# Patient Record
Sex: Female | Born: 1965 | Race: Black or African American | Hispanic: No | Marital: Single | Smoking: Former smoker
Health system: Southern US, Community
[De-identification: ages and names within clinical notes are randomized; demographics above are authoritative.]

## PROBLEM LIST (undated history)

## (undated) DIAGNOSIS — E039 Hypothyroidism, unspecified: Secondary | ICD-10-CM

## (undated) DIAGNOSIS — M1990 Unspecified osteoarthritis, unspecified site: Secondary | ICD-10-CM

## (undated) DIAGNOSIS — Z21 Asymptomatic human immunodeficiency virus [HIV] infection status: Secondary | ICD-10-CM

## (undated) DIAGNOSIS — E119 Type 2 diabetes mellitus without complications: Secondary | ICD-10-CM

## (undated) DIAGNOSIS — Z8719 Personal history of other diseases of the digestive system: Secondary | ICD-10-CM

## (undated) DIAGNOSIS — K219 Gastro-esophageal reflux disease without esophagitis: Secondary | ICD-10-CM

## (undated) DIAGNOSIS — B20 Human immunodeficiency virus [HIV] disease: Secondary | ICD-10-CM

## (undated) DIAGNOSIS — I10 Essential (primary) hypertension: Secondary | ICD-10-CM

## (undated) DIAGNOSIS — N3281 Overactive bladder: Secondary | ICD-10-CM

## (undated) DIAGNOSIS — T7840XA Allergy, unspecified, initial encounter: Secondary | ICD-10-CM

## (undated) DIAGNOSIS — J189 Pneumonia, unspecified organism: Secondary | ICD-10-CM

## (undated) HISTORY — PX: ABDOMINAL HYSTERECTOMY: SHX81

## (undated) HISTORY — PX: CRYOTHERAPY: SHX1416

## (undated) HISTORY — DX: Essential (primary) hypertension: I10

## (undated) HISTORY — PX: BREAST SURGERY: SHX581

## (undated) HISTORY — DX: Allergy, unspecified, initial encounter: T78.40XA

## (undated) HISTORY — PX: BIOPSY THYROID: PRO38

## (undated) HISTORY — DX: Overactive bladder: N32.81

---

## 2005-09-30 DIAGNOSIS — J189 Pneumonia, unspecified organism: Secondary | ICD-10-CM

## 2005-09-30 HISTORY — DX: Pneumonia, unspecified organism: J18.9

## 2009-09-30 HISTORY — PX: JOINT REPLACEMENT: SHX530

## 2010-09-30 HISTORY — PX: CARPAL TUNNEL RELEASE: SHX101

## 2011-10-01 HISTORY — PX: CERVIX SURGERY: SHX593

## 2012-09-30 HISTORY — PX: COLONOSCOPY: SHX174

## 2015-10-12 DIAGNOSIS — H25013 Cortical age-related cataract, bilateral: Secondary | ICD-10-CM | POA: Diagnosis not present

## 2015-10-12 DIAGNOSIS — H524 Presbyopia: Secondary | ICD-10-CM | POA: Diagnosis not present

## 2015-10-12 DIAGNOSIS — H11153 Pinguecula, bilateral: Secondary | ICD-10-CM | POA: Diagnosis not present

## 2015-10-19 DIAGNOSIS — M25552 Pain in left hip: Secondary | ICD-10-CM | POA: Diagnosis not present

## 2015-10-19 DIAGNOSIS — Z9889 Other specified postprocedural states: Secondary | ICD-10-CM | POA: Diagnosis not present

## 2015-10-19 DIAGNOSIS — R200 Anesthesia of skin: Secondary | ICD-10-CM | POA: Diagnosis not present

## 2015-10-19 DIAGNOSIS — M545 Low back pain: Secondary | ICD-10-CM | POA: Diagnosis not present

## 2015-10-31 DIAGNOSIS — E7800 Pure hypercholesterolemia, unspecified: Secondary | ICD-10-CM | POA: Diagnosis not present

## 2015-10-31 DIAGNOSIS — K219 Gastro-esophageal reflux disease without esophagitis: Secondary | ICD-10-CM | POA: Diagnosis not present

## 2015-10-31 DIAGNOSIS — Z21 Asymptomatic human immunodeficiency virus [HIV] infection status: Secondary | ICD-10-CM | POA: Diagnosis not present

## 2015-10-31 DIAGNOSIS — E039 Hypothyroidism, unspecified: Secondary | ICD-10-CM | POA: Diagnosis not present

## 2015-10-31 DIAGNOSIS — M79671 Pain in right foot: Secondary | ICD-10-CM | POA: Diagnosis not present

## 2015-10-31 DIAGNOSIS — I10 Essential (primary) hypertension: Secondary | ICD-10-CM | POA: Diagnosis not present

## 2015-10-31 DIAGNOSIS — G8929 Other chronic pain: Secondary | ICD-10-CM | POA: Diagnosis not present

## 2015-11-17 DIAGNOSIS — I10 Essential (primary) hypertension: Secondary | ICD-10-CM | POA: Diagnosis not present

## 2015-11-17 DIAGNOSIS — E7800 Pure hypercholesterolemia, unspecified: Secondary | ICD-10-CM | POA: Diagnosis not present

## 2015-11-17 DIAGNOSIS — J019 Acute sinusitis, unspecified: Secondary | ICD-10-CM | POA: Diagnosis not present

## 2015-11-17 DIAGNOSIS — R5383 Other fatigue: Secondary | ICD-10-CM | POA: Diagnosis not present

## 2015-11-17 DIAGNOSIS — E039 Hypothyroidism, unspecified: Secondary | ICD-10-CM | POA: Diagnosis not present

## 2016-01-17 DIAGNOSIS — E069 Thyroiditis, unspecified: Secondary | ICD-10-CM | POA: Diagnosis not present

## 2016-01-17 DIAGNOSIS — B20 Human immunodeficiency virus [HIV] disease: Secondary | ICD-10-CM | POA: Diagnosis not present

## 2016-01-23 DIAGNOSIS — Z1231 Encounter for screening mammogram for malignant neoplasm of breast: Secondary | ICD-10-CM | POA: Diagnosis not present

## 2016-01-31 DIAGNOSIS — B20 Human immunodeficiency virus [HIV] disease: Secondary | ICD-10-CM | POA: Diagnosis not present

## 2016-01-31 DIAGNOSIS — I10 Essential (primary) hypertension: Secondary | ICD-10-CM | POA: Diagnosis not present

## 2016-01-31 DIAGNOSIS — E039 Hypothyroidism, unspecified: Secondary | ICD-10-CM | POA: Diagnosis not present

## 2016-03-08 DIAGNOSIS — B360 Pityriasis versicolor: Secondary | ICD-10-CM | POA: Diagnosis not present

## 2016-03-08 DIAGNOSIS — L818 Other specified disorders of pigmentation: Secondary | ICD-10-CM | POA: Diagnosis not present

## 2016-03-25 DIAGNOSIS — E669 Obesity, unspecified: Secondary | ICD-10-CM | POA: Diagnosis not present

## 2016-03-25 DIAGNOSIS — I10 Essential (primary) hypertension: Secondary | ICD-10-CM | POA: Diagnosis not present

## 2016-03-25 DIAGNOSIS — B20 Human immunodeficiency virus [HIV] disease: Secondary | ICD-10-CM | POA: Diagnosis not present

## 2016-03-25 DIAGNOSIS — Z6838 Body mass index (BMI) 38.0-38.9, adult: Secondary | ICD-10-CM | POA: Diagnosis not present

## 2016-05-31 DIAGNOSIS — B20 Human immunodeficiency virus [HIV] disease: Secondary | ICD-10-CM | POA: Diagnosis not present

## 2016-05-31 DIAGNOSIS — E785 Hyperlipidemia, unspecified: Secondary | ICD-10-CM | POA: Diagnosis not present

## 2016-05-31 DIAGNOSIS — Z79899 Other long term (current) drug therapy: Secondary | ICD-10-CM | POA: Diagnosis not present

## 2016-06-05 DIAGNOSIS — R7303 Prediabetes: Secondary | ICD-10-CM | POA: Diagnosis not present

## 2016-06-05 DIAGNOSIS — B20 Human immunodeficiency virus [HIV] disease: Secondary | ICD-10-CM | POA: Diagnosis not present

## 2016-06-05 DIAGNOSIS — E669 Obesity, unspecified: Secondary | ICD-10-CM | POA: Diagnosis not present

## 2016-06-05 DIAGNOSIS — E785 Hyperlipidemia, unspecified: Secondary | ICD-10-CM | POA: Diagnosis not present

## 2016-06-05 DIAGNOSIS — E039 Hypothyroidism, unspecified: Secondary | ICD-10-CM | POA: Diagnosis not present

## 2016-06-05 DIAGNOSIS — Z6839 Body mass index (BMI) 39.0-39.9, adult: Secondary | ICD-10-CM | POA: Diagnosis not present

## 2016-06-05 DIAGNOSIS — R635 Abnormal weight gain: Secondary | ICD-10-CM | POA: Diagnosis not present

## 2016-06-05 DIAGNOSIS — I10 Essential (primary) hypertension: Secondary | ICD-10-CM | POA: Diagnosis not present

## 2016-06-24 DIAGNOSIS — E039 Hypothyroidism, unspecified: Secondary | ICD-10-CM | POA: Diagnosis not present

## 2016-06-24 DIAGNOSIS — I10 Essential (primary) hypertension: Secondary | ICD-10-CM | POA: Diagnosis not present

## 2016-06-24 DIAGNOSIS — E669 Obesity, unspecified: Secondary | ICD-10-CM | POA: Diagnosis not present

## 2016-06-24 DIAGNOSIS — Z6839 Body mass index (BMI) 39.0-39.9, adult: Secondary | ICD-10-CM | POA: Diagnosis not present

## 2016-06-24 DIAGNOSIS — E785 Hyperlipidemia, unspecified: Secondary | ICD-10-CM | POA: Diagnosis not present

## 2016-06-24 DIAGNOSIS — F17210 Nicotine dependence, cigarettes, uncomplicated: Secondary | ICD-10-CM | POA: Diagnosis not present

## 2016-06-24 DIAGNOSIS — Z1389 Encounter for screening for other disorder: Secondary | ICD-10-CM | POA: Diagnosis not present

## 2016-06-24 DIAGNOSIS — K219 Gastro-esophageal reflux disease without esophagitis: Secondary | ICD-10-CM | POA: Diagnosis not present

## 2016-06-24 DIAGNOSIS — B20 Human immunodeficiency virus [HIV] disease: Secondary | ICD-10-CM | POA: Diagnosis not present

## 2016-07-09 DIAGNOSIS — H2513 Age-related nuclear cataract, bilateral: Secondary | ICD-10-CM | POA: Diagnosis not present

## 2016-07-09 DIAGNOSIS — H11153 Pinguecula, bilateral: Secondary | ICD-10-CM | POA: Diagnosis not present

## 2016-07-09 DIAGNOSIS — H524 Presbyopia: Secondary | ICD-10-CM | POA: Diagnosis not present

## 2016-07-30 DIAGNOSIS — E785 Hyperlipidemia, unspecified: Secondary | ICD-10-CM | POA: Diagnosis not present

## 2016-07-30 DIAGNOSIS — K219 Gastro-esophageal reflux disease without esophagitis: Secondary | ICD-10-CM | POA: Diagnosis not present

## 2016-07-30 DIAGNOSIS — I10 Essential (primary) hypertension: Secondary | ICD-10-CM | POA: Diagnosis not present

## 2016-07-30 DIAGNOSIS — E039 Hypothyroidism, unspecified: Secondary | ICD-10-CM | POA: Diagnosis not present

## 2016-09-29 DIAGNOSIS — E039 Hypothyroidism, unspecified: Secondary | ICD-10-CM | POA: Diagnosis not present

## 2016-09-29 DIAGNOSIS — I10 Essential (primary) hypertension: Secondary | ICD-10-CM | POA: Diagnosis not present

## 2016-09-29 DIAGNOSIS — E785 Hyperlipidemia, unspecified: Secondary | ICD-10-CM | POA: Diagnosis not present

## 2016-09-29 DIAGNOSIS — K219 Gastro-esophageal reflux disease without esophagitis: Secondary | ICD-10-CM | POA: Diagnosis not present

## 2016-10-30 DIAGNOSIS — E039 Hypothyroidism, unspecified: Secondary | ICD-10-CM | POA: Diagnosis not present

## 2016-10-30 DIAGNOSIS — E785 Hyperlipidemia, unspecified: Secondary | ICD-10-CM | POA: Diagnosis not present

## 2016-10-30 DIAGNOSIS — I10 Essential (primary) hypertension: Secondary | ICD-10-CM | POA: Diagnosis not present

## 2016-10-30 DIAGNOSIS — K219 Gastro-esophageal reflux disease without esophagitis: Secondary | ICD-10-CM | POA: Diagnosis not present

## 2016-11-27 DIAGNOSIS — I10 Essential (primary) hypertension: Secondary | ICD-10-CM | POA: Diagnosis not present

## 2016-11-27 DIAGNOSIS — E785 Hyperlipidemia, unspecified: Secondary | ICD-10-CM | POA: Diagnosis not present

## 2016-11-27 DIAGNOSIS — K219 Gastro-esophageal reflux disease without esophagitis: Secondary | ICD-10-CM | POA: Diagnosis not present

## 2016-11-27 DIAGNOSIS — E039 Hypothyroidism, unspecified: Secondary | ICD-10-CM | POA: Diagnosis not present

## 2016-12-28 DIAGNOSIS — I10 Essential (primary) hypertension: Secondary | ICD-10-CM | POA: Diagnosis not present

## 2016-12-28 DIAGNOSIS — K219 Gastro-esophageal reflux disease without esophagitis: Secondary | ICD-10-CM | POA: Diagnosis not present

## 2016-12-28 DIAGNOSIS — E785 Hyperlipidemia, unspecified: Secondary | ICD-10-CM | POA: Diagnosis not present

## 2016-12-28 DIAGNOSIS — E039 Hypothyroidism, unspecified: Secondary | ICD-10-CM | POA: Diagnosis not present

## 2017-01-01 DIAGNOSIS — J0100 Acute maxillary sinusitis, unspecified: Secondary | ICD-10-CM | POA: Diagnosis not present

## 2017-01-01 DIAGNOSIS — J309 Allergic rhinitis, unspecified: Secondary | ICD-10-CM | POA: Diagnosis not present

## 2017-01-27 DIAGNOSIS — K219 Gastro-esophageal reflux disease without esophagitis: Secondary | ICD-10-CM | POA: Diagnosis not present

## 2017-01-27 DIAGNOSIS — E785 Hyperlipidemia, unspecified: Secondary | ICD-10-CM | POA: Diagnosis not present

## 2017-01-27 DIAGNOSIS — I10 Essential (primary) hypertension: Secondary | ICD-10-CM | POA: Diagnosis not present

## 2017-01-27 DIAGNOSIS — E039 Hypothyroidism, unspecified: Secondary | ICD-10-CM | POA: Diagnosis not present

## 2017-02-14 DIAGNOSIS — I10 Essential (primary) hypertension: Secondary | ICD-10-CM | POA: Diagnosis not present

## 2017-02-14 DIAGNOSIS — E785 Hyperlipidemia, unspecified: Secondary | ICD-10-CM | POA: Diagnosis not present

## 2017-02-14 DIAGNOSIS — B20 Human immunodeficiency virus [HIV] disease: Secondary | ICD-10-CM | POA: Diagnosis not present

## 2017-02-14 DIAGNOSIS — K219 Gastro-esophageal reflux disease without esophagitis: Secondary | ICD-10-CM | POA: Diagnosis not present

## 2017-02-27 DIAGNOSIS — I10 Essential (primary) hypertension: Secondary | ICD-10-CM | POA: Diagnosis not present

## 2017-02-27 DIAGNOSIS — K219 Gastro-esophageal reflux disease without esophagitis: Secondary | ICD-10-CM | POA: Diagnosis not present

## 2017-02-27 DIAGNOSIS — E785 Hyperlipidemia, unspecified: Secondary | ICD-10-CM | POA: Diagnosis not present

## 2017-02-27 DIAGNOSIS — E039 Hypothyroidism, unspecified: Secondary | ICD-10-CM | POA: Diagnosis not present

## 2017-04-16 DIAGNOSIS — E039 Hypothyroidism, unspecified: Secondary | ICD-10-CM | POA: Diagnosis not present

## 2017-04-16 DIAGNOSIS — I10 Essential (primary) hypertension: Secondary | ICD-10-CM | POA: Diagnosis not present

## 2017-04-16 DIAGNOSIS — K210 Gastro-esophageal reflux disease with esophagitis: Secondary | ICD-10-CM | POA: Diagnosis not present

## 2017-04-16 DIAGNOSIS — E782 Mixed hyperlipidemia: Secondary | ICD-10-CM | POA: Diagnosis not present

## 2017-04-16 DIAGNOSIS — B20 Human immunodeficiency virus [HIV] disease: Secondary | ICD-10-CM | POA: Diagnosis not present

## 2017-04-16 DIAGNOSIS — E669 Obesity, unspecified: Secondary | ICD-10-CM | POA: Diagnosis not present

## 2017-05-19 DIAGNOSIS — N390 Urinary tract infection, site not specified: Secondary | ICD-10-CM | POA: Diagnosis not present

## 2017-05-19 DIAGNOSIS — R1030 Lower abdominal pain, unspecified: Secondary | ICD-10-CM | POA: Diagnosis not present

## 2017-05-19 DIAGNOSIS — I10 Essential (primary) hypertension: Secondary | ICD-10-CM | POA: Diagnosis not present

## 2017-05-26 DIAGNOSIS — R1030 Lower abdominal pain, unspecified: Secondary | ICD-10-CM | POA: Diagnosis not present

## 2017-06-03 DIAGNOSIS — B20 Human immunodeficiency virus [HIV] disease: Secondary | ICD-10-CM | POA: Diagnosis not present

## 2017-06-03 DIAGNOSIS — N390 Urinary tract infection, site not specified: Secondary | ICD-10-CM | POA: Diagnosis not present

## 2017-06-03 DIAGNOSIS — N8500 Endometrial hyperplasia, unspecified: Secondary | ICD-10-CM | POA: Diagnosis not present

## 2017-06-03 DIAGNOSIS — E039 Hypothyroidism, unspecified: Secondary | ICD-10-CM | POA: Diagnosis not present

## 2017-06-03 DIAGNOSIS — R1030 Lower abdominal pain, unspecified: Secondary | ICD-10-CM | POA: Diagnosis not present

## 2017-06-04 ENCOUNTER — Telehealth: Payer: Self-pay | Admitting: Obstetrics & Gynecology

## 2017-06-04 NOTE — Telephone Encounter (Signed)
[facility] Referring pt for  Pelvic pain and endometrial  thicken on pelvic ultrsound,. Called and lvm for pt to call back to be schedule

## 2017-06-09 ENCOUNTER — Encounter: Payer: Self-pay | Admitting: Obstetrics and Gynecology

## 2017-06-09 ENCOUNTER — Ambulatory Visit (INDEPENDENT_AMBULATORY_CARE_PROVIDER_SITE_OTHER): Payer: Medicare Other | Admitting: Obstetrics and Gynecology

## 2017-06-09 VITALS — BP 130/80 | HR 76 | Ht 67.0 in | Wt 269.0 lb

## 2017-06-09 DIAGNOSIS — B20 Human immunodeficiency virus [HIV] disease: Secondary | ICD-10-CM | POA: Diagnosis not present

## 2017-06-09 DIAGNOSIS — Z21 Asymptomatic human immunodeficiency virus [HIV] infection status: Secondary | ICD-10-CM | POA: Insufficient documentation

## 2017-06-09 DIAGNOSIS — I10 Essential (primary) hypertension: Secondary | ICD-10-CM | POA: Insufficient documentation

## 2017-06-09 DIAGNOSIS — R102 Pelvic and perineal pain: Secondary | ICD-10-CM | POA: Diagnosis not present

## 2017-06-09 DIAGNOSIS — E7800 Pure hypercholesterolemia, unspecified: Secondary | ICD-10-CM | POA: Insufficient documentation

## 2017-06-09 DIAGNOSIS — R9389 Abnormal findings on diagnostic imaging of other specified body structures: Secondary | ICD-10-CM | POA: Insufficient documentation

## 2017-06-09 DIAGNOSIS — E039 Hypothyroidism, unspecified: Secondary | ICD-10-CM | POA: Insufficient documentation

## 2017-06-09 DIAGNOSIS — R938 Abnormal findings on diagnostic imaging of other specified body structures: Secondary | ICD-10-CM | POA: Diagnosis not present

## 2017-06-09 DIAGNOSIS — E041 Nontoxic single thyroid nodule: Secondary | ICD-10-CM | POA: Insufficient documentation

## 2017-06-09 NOTE — Progress Notes (Addendum)
Chief Complaint  Patient presents with  . Pelvic Pain    HPI:      [name] is a 51 y.o. No obstetric history on file. who LMP was No LMP recorded. Patient is postmenopausal., presents today for NP eval of pelvic pain for about the past month. Pt saw her PCP (Boscia, Kathlynn GrateHeather E, NP) who ordered an u/s. U/S was normal except for thickened EM of 18 mm. Pt hasn't had a period in 7-8 yrs and denies any vaginal bleeding, spotting. FSH was 9.1 and LH was 2.8 on 8/18 labs with PCP. She notes achy pelvic pain with movement, particularly rolling over in bed or standing up. She denies any pain at rest. She has not used any meds to treat pain. She denies any heavy lifting, pelvic trauma/injury. She is s/p LT Hip replacement--no pain in hip currently.   She was treated for UTI with cipro by PCP without any change in pain sx. No GI sx--she has regular BMs daily. She is current on colonoscopy.  No hx of endometriosis/hx of pelvic pain.   She has dysuria due to vaginal dryness. PCP discussed starting meds after pelvic pain/thickened EM eval. She is not sex active with men. She is s/p cervical cryotherapy about 5 yr ago (under anesthesia) and has had normal paps since. She is current on pap smears.   Active Ambulatory Problems    Diagnosis Date Noted  . Hypothyroidism 06/09/2017  . Hypercholesteremia 06/09/2017  . HIV (human immunodeficiency virus infection) (HCC) 06/09/2017  . Hypertension 06/09/2017  . Endometrial thickening on ultrasound 06/09/2017   Resolved Ambulatory Problems    Diagnosis Date Noted  . No Resolved Ambulatory Problems   Past Medical History:  Diagnosis Date  . Hypertension      Past Surgical History:  Procedure Laterality Date  . COLONOSCOPY  2014   polyps benign  . CRYOTHERAPY  2013-2014    Family History  Problem Relation Age of Onset  . Breast cancer Mother     Social History   Social History  . Marital status: Single    Spouse name: N/A  . Number  of children: N/A  . Years of education: N/A   Occupational History  . Not on file.   Social History Main Topics  . Smoking status: Former Smoker    Packs/day: 0.50    Years: 41.00    Types: Cigarettes    Start date: 12/30/1975    Quit date: 01/05/2017  . Smokeless tobacco: Never Used  . Alcohol use Yes     Comment: Occasional  . Drug use: No  . Sexual activity: Yes   Other Topics Concern  . Not on file   Social History Narrative  . No narrative on file     Current Outpatient Prescriptions:  .  abacavir-dolutegravir-lamiVUDine (TRIUMEQ) 600-50-300 MG tablet, Take 1 tablet by mouth daily., Disp: , Rfl:  .  levothyroxine (SYNTHROID, LEVOTHROID) 50 MCG tablet, Take 50 mcg by mouth daily before breakfast., Disp: , Rfl:  .  metoprolol tartrate (LOPRESSOR) 50 MG tablet, Take 50 mg by mouth 2 (two) times daily., Disp: , Rfl:  .  omeprazole (PRILOSEC) 20 MG capsule, Take 20 mg by mouth 2 (two) times daily before a meal., Disp: , Rfl:  .  rosuvastatin (CRESTOR) 5 MG tablet, Take 5 mg by mouth daily., Disp: , Rfl:    ROS:  Review of Systems  Constitutional: Negative for fever.  Gastrointestinal: Negative for blood in stool, constipation, diarrhea,  nausea and vomiting.  Genitourinary: Positive for dysuria and pelvic pain. Negative for dyspareunia, flank pain, frequency, hematuria, urgency, vaginal bleeding, vaginal discharge and vaginal pain.  Musculoskeletal: Positive for arthralgias. Negative for back pain.  Skin: Negative for rash.     OBJECTIVE:   Vitals:  BP 130/80 (BP Location: Left Arm, Patient Position: Sitting, Cuff Size: Normal)   Pulse 76   Ht  (1.702 m)   Wt 269 lb (122 kg)   BMI 42.13 kg/m   Physical Exam  Constitutional: She is oriented to person, place, and time and well-developed, well-nourished, and in no distress. Vital signs are normal.  Genitourinary: Vagina normal, cervix normal, right adnexa normal, left adnexa normal and vulva normal. Uterus is  not enlarged and not tender. Cervix exhibits no motion tenderness and no tenderness. Right adnexum displays no mass and no tenderness. Left adnexum displays no mass and no tenderness. Vulva exhibits no erythema, no exudate, no lesion, no rash and no tenderness. Vagina exhibits no lesion.  Neurological: She is oriented to person, place, and time.  Psychiatric: Memory, affect and judgment normal.  Vitals reviewed.   EMB ATTEMPTED BUT PT INTOLERANT OF VAGINAL/SPECULUM EXAM. PT INTERESTED IN BX WITH D&C.  Assessment/Plan: Endometrial thickening on ultrasound - Pt couldn't tolerate EMB today. Prefers D&C for pathology/bx info. Nancy to sched D&C. Pt to RTO wtih Dr. Tiburcio Pea for H&P/answer procedure questions.   Pelvic pain - Rule out GYN etiology with D&C. If path neg, sx most likely musculoskeletal given pain with movement only.   HIV (human immunodeficiency virus infection) (HCC)    Return for H&P with Dr. Tiburcio Pea.  Johnelle Tafolla B. Ewa Hipp, PA-C 06/10/2017 10:03 AM

## 2017-06-11 NOTE — Telephone Encounter (Signed)
Lmtrc

## 2017-06-11 NOTE — Telephone Encounter (Signed)
-----   Message from [place] RecordsAlicia B Copland, [place]PA-C sent at 06/09/2017 12:01 PM EDT ----- Regarding: surgery Surgery Booking Request Patient Full Name:  Misty Larsen MRN: [id number]  DOB: 1966-01-22  Surgeon: Letitia Libraobert Paul Harris Requested Surgery Date and Time: any (call pt to determine) Primary Diagnosis AND Code: endometrial thickening Secondary Diagnosis and Code: pelvic pain Surgical Procedure: D&C  L&D Notification: No Admission Status: same day surgery Length of Surgery:  Special Case Needs:  H&P:  (date): schedule with pt with Dr. Tiburcio PeaHarris Phone Interview???: Interpreter:no Language:  Medical Clearance: Special Scheduling Instructions: Pt to [facility] Dr[facility]Harris [facility] couldn't tolerate [facility]. To save pt an extra visit, pls sched surg and DR. Tiburcio PeaHarris will discuss with pt at H&P appt (per Dr. Tiburcio PeaHarris)

## 2017-06-11 NOTE — Telephone Encounter (Signed)
Patient is aware of H&P on 07/02/17 @ 8:40am w/ Dr. Tiburcio PeaHarris, Pre-admit Testing phone interview afterwards, and OR on 07/08/17. Ext given.

## 2017-06-11 NOTE — Telephone Encounter (Signed)
-----   Message from Alicia B Copland, PA-C sent at 06/09/2017 12:01 PM EDT ----- °Regarding: surgery °Surgery Booking Request °Patient Full Name:  Misty Larsen °MRN: [id number]  °DOB: 06/15/1966  °Surgeon: Robert Paul Harris °Requested Surgery Date and Time: any (call pt to determine) °Primary Diagnosis AND Code: endometrial thickening °Secondary Diagnosis and Code: pelvic pain °Surgical Procedure: D&C ° °L&D Notification: No °Admission Status: same day surgery °Length of Surgery:  °Special Case Needs:  °H&P:  (date): schedule with pt with Dr. Harris °Phone Interview???: °Interpreter:no °Language:  °Medical Clearance: °Special Scheduling Instructions: Pt to see Dr. Harris for D&C since pt couldn't tolerate EMB. To save pt an extra visit, pls sched surg and DR. Harris will discuss with pt at H&P appt (per Dr. Harris) °

## 2017-06-16 DIAGNOSIS — E039 Hypothyroidism, unspecified: Secondary | ICD-10-CM | POA: Diagnosis not present

## 2017-06-16 DIAGNOSIS — E782 Mixed hyperlipidemia: Secondary | ICD-10-CM | POA: Diagnosis not present

## 2017-06-16 DIAGNOSIS — E559 Vitamin D deficiency, unspecified: Secondary | ICD-10-CM | POA: Diagnosis not present

## 2017-06-16 DIAGNOSIS — R635 Abnormal weight gain: Secondary | ICD-10-CM | POA: Diagnosis not present

## 2017-06-16 DIAGNOSIS — R7301 Impaired fasting glucose: Secondary | ICD-10-CM | POA: Diagnosis not present

## 2017-06-18 DIAGNOSIS — E559 Vitamin D deficiency, unspecified: Secondary | ICD-10-CM | POA: Diagnosis not present

## 2017-06-18 DIAGNOSIS — E669 Obesity, unspecified: Secondary | ICD-10-CM | POA: Diagnosis not present

## 2017-06-18 DIAGNOSIS — B20 Human immunodeficiency virus [HIV] disease: Secondary | ICD-10-CM | POA: Diagnosis not present

## 2017-06-18 DIAGNOSIS — R1030 Lower abdominal pain, unspecified: Secondary | ICD-10-CM | POA: Diagnosis not present

## 2017-06-18 DIAGNOSIS — Z0001 Encounter for general adult medical examination with abnormal findings: Secondary | ICD-10-CM | POA: Diagnosis not present

## 2017-06-18 DIAGNOSIS — E1165 Type 2 diabetes mellitus with hyperglycemia: Secondary | ICD-10-CM | POA: Diagnosis not present

## 2017-06-18 DIAGNOSIS — Z124 Encounter for screening for malignant neoplasm of cervix: Secondary | ICD-10-CM | POA: Diagnosis not present

## 2017-06-25 ENCOUNTER — Emergency Department: Payer: Medicare Other

## 2017-06-25 ENCOUNTER — Emergency Department
Admission: EM | Admit: 2017-06-25 | Discharge: 2017-06-25 | Disposition: A | Discharge status: Home / Self Care | Payer: Medicare Other | Attending: Emergency Medicine | Admitting: Emergency Medicine

## 2017-06-25 ENCOUNTER — Encounter: Payer: Self-pay | Admitting: Emergency Medicine

## 2017-06-25 DIAGNOSIS — Z79899 Other long term (current) drug therapy: Secondary | ICD-10-CM | POA: Insufficient documentation

## 2017-06-25 DIAGNOSIS — E039 Hypothyroidism, unspecified: Secondary | ICD-10-CM | POA: Insufficient documentation

## 2017-06-25 DIAGNOSIS — E119 Type 2 diabetes mellitus without complications: Secondary | ICD-10-CM | POA: Diagnosis not present

## 2017-06-25 DIAGNOSIS — I10 Essential (primary) hypertension: Secondary | ICD-10-CM | POA: Diagnosis not present

## 2017-06-25 DIAGNOSIS — Z87891 Personal history of nicotine dependence: Secondary | ICD-10-CM | POA: Diagnosis not present

## 2017-06-25 DIAGNOSIS — R079 Chest pain, unspecified: Secondary | ICD-10-CM | POA: Diagnosis not present

## 2017-06-25 HISTORY — DX: Type 2 diabetes mellitus without complications: E11.9

## 2017-06-25 LAB — BASIC METABOLIC PANEL
ANION GAP: 8 (ref 5–15)
BUN: 20 mg/dL (ref 6–20)
CHLORIDE: 106 mmol/L (ref 101–111)
CO2: 24 mmol/L (ref 22–32)
Calcium: 9.1 mg/dL (ref 8.9–10.3)
Creatinine, Ser: 1.03 mg/dL — ABNORMAL HIGH (ref 0.44–1.00)
GFR calc non Af Amer: >60 mL/min (ref >60)
GLUCOSE: 95 mg/dL (ref 65–99)
POTASSIUM: 3.8 mmol/L (ref 3.5–5.1)
Sodium: 138 mmol/L (ref 135–145)

## 2017-06-25 LAB — CBC
HEMATOCRIT: 44.5 % (ref 35.0–47.0)
Hemoglobin: 14.8 g/dL (ref 12.0–16.0)
MCH: 28.1 pg (ref 26.0–34.0)
MCHC: 33.3 g/dL (ref 32.0–36.0)
MCV: 84.4 fL (ref 80.0–100.0)
PLATELETS: 233 10*3/uL (ref 150–440)
RBC: 5.27 MIL/uL — ABNORMAL HIGH (ref 3.80–5.20)
RDW: 13.8 % (ref 11.5–14.5)
WBC: 8 10*3/uL (ref 3.6–11.0)

## 2017-06-25 LAB — TROPONIN I: Troponin I: <0.03 ng/mL (ref <0.03)

## 2017-06-25 NOTE — ED Notes (Signed)
MD at bedside. 

## 2017-06-25 NOTE — ED Provider Notes (Signed)
[facility] Emergency Department Provider Note  Time seen: 3:35 PM  I have reviewed the triage vital signs and the nursing notes.   HISTORY  Chief Complaint Chest Pain    HPI Misty Larsen is a 51 y.o. female With a past medical history of diabetes, hypertension,HIV, reflux, presents to the emergency department for chest pain.according to the patient just before 12 PM she ate a double quarter pounder cheeseburger. States she was running late for work so she very quickly and took an omeprazole. Patient states she got to work around 12:00 and shortly after began having a sensation of pain in the center of her chest. States it lasted briefly and went away. Happen once again so the patient left work and came to the emergency department for evaluation. She states after went away the second time it has not returned. She is not had any pain from almost 3 hours at this time. Denies any nausea, shortness of breath or sweatiness.patient states a history of gastric reflux as well as a hiatal hernia which she believes is what caused her pain but she wanted to come to the emergency department to be sure.  Past Medical History:  Diagnosis Date  . Diabetes mellitus without complication (HCC)   . Hypertension     Patient Active Problem List   Diagnosis Date Noted  . Hypothyroidism 06/09/2017  . Hypercholesteremia 06/09/2017  . HIV (human immunodeficiency virus infection) (HCC) 06/09/2017  . Hypertension 06/09/2017  . Endometrial thickening on ultrasound 06/09/2017    Past Surgical History:  Procedure Laterality Date  . COLONOSCOPY  2014   polyps benign  . CRYOTHERAPY  2013-2014    Prior to Admission medications   Medication Sig Start Date End Date Taking? Authorizing Provider  abacavir-dolutegravir-lamiVUDine (TRIUMEQ) 600-50-300 MG tablet Take 1 tablet by mouth daily.    [provider]  levothyroxine (SYNTHROID, LEVOTHROID) 50 MCG tablet Take 50 mcg by  mouth daily before breakfast.    [provider]  metoprolol tartrate (LOPRESSOR) 50 MG tablet Take 50 mg by mouth 2 (two) times daily.    [provider]  omeprazole (PRILOSEC) 20 MG capsule Take 20 mg by mouth 2 (two) times daily before a meal.    [provider]  rosuvastatin (CRESTOR) 5 MG tablet Take 5 mg by mouth daily.    [provider]    Allergies  Allergen Reactions  . Sulfa Antibiotics     Family History  Problem Relation Age of Onset  . Breast cancer Mother     Social History Social History  Substance Use Topics  . Smoking status: Former Smoker    Packs/day: 0.50    Years: 41.00    Types: Cigarettes    Start date: 12/30/1975    Quit date: 01/05/2017  . Smokeless tobacco: Never Used  . Alcohol use Yes     Comment: Occasional    Review of Systems Constitutional: Negative for fever Cardiovascular: chest pain 2 earlier today. Mild and brief per patient, completely resolved at this time. Respiratory: Negative for shortness of breath. Gastrointestinal: Negative for abdominal pain, vomiting  Musculoskeletal: Negative for leg pain All other ROS negative  ____________________________________________   PHYSICAL EXAM:  VITAL SIGNS: ED Triage Vitals [06/25/17 1319]  Enc Vitals Group     BP (!) 145/95     Pulse Rate 79     Resp 17     Temp 98.3 F (36.8 C)     Temp Source Oral  SpO2 99 %     Weight 266 lb (120.7 kg)     Height  (1.702 m)     Head Circumference      Peak Flow      Pain Score      Pain Loc      Pain Edu?      Excl. in GC?     Constitutional: Alert and oriented. Well appearing and in no distress. Eyes: Normal exam ENT   Head: Normocephalic and atraumatic   Mouth/Throat: Mucous membranes are moist. Cardiovascular: Normal rate, regular rhythm. No murmur Respiratory: Normal respiratory effort without tachypnea nor retractions. Breath sounds are clear  Gastrointestinal: Soft and nontender.  No distention. Musculoskeletal: Nontender with normal range of motion in all extremities.  Neurologic:  Normal speech and language. No gross focal neurologic deficits Skin:  Skin is warm, dry and intact.  Psychiatric: Mood and affect are normal.  ____________________________________________    EKG  EKG reviewed and interpreted by myself shows normal sinus rhythm at 76 bpm, narrow QRS, normal axis, normal intervals, no concerning ST changes.  ____________________________________________    RADIOLOGY  chest x-ray normal  ____________________________________________   INITIAL IMPRESSION / ASSESSMENT AND PLAN / ED COURSE  Pertinent labs & imaging results that were available during my care of the patient were reviewed by me and considered in my medical decision making (see chart for details).  patient presents to the emergency department for chest pain after eating a cheeseburger very quickly. Patient states 2 episodes of chest pain which are very brief, denies any nausea, diaphoresis or shortness of breath. Differential this time would include ACS, reflux, hiatal hernia related discomfort. Patient's labs including cardiac enzymes are normal. Chest x-ray is normal. Patient states she has belched several times which is abnormal for her. Symptoms are very suggestive of GI pathology. As the patient has been symptom free for almost 3 hours with a normal workup and a normal physical exam I believe the patient is safe for discharge home. However given the patient's age and comorbidities I discussed following up with a cardiologist for stress test. The patient is agreeable to this plan. I also discussed by normal chest pain return precautions.  ____________________________________________   FINAL CLINICAL IMPRESSION(S) / ED DIAGNOSES  chest pain    Minna Antis, MD 06/25/17 1539

## 2017-06-25 NOTE — ED Triage Notes (Signed)
Pt in via POV with complaints of episode of chest pain today after eating.  Pt denies any accompanying symptoms, denies any pain at this time.  Vitals WDL, NAD noted at this time.

## 2017-06-25 NOTE — Discharge Instructions (Signed)
You have been seen in the emergency department today for chest pain. Your workup has shown normal results. As we discussed please follow-up with your primary care physician in the next 1-2 days for recheck. Return to the emergency department for any further chest pain, trouble breathing, or any other symptom personally concerning to yourself.  Please call the number provided for cardiology to arrange a follow-up appointment for consideration of a stress test as soon as possible.

## 2017-06-25 NOTE — ED Notes (Signed)
PT reported she forgot to tell triage RN that she has a known hx of hiatal hernia.

## 2017-07-02 ENCOUNTER — Encounter: Payer: Medicare Other | Admitting: Obstetrics & Gynecology

## 2017-07-04 ENCOUNTER — Encounter: Payer: Self-pay | Admitting: Obstetrics & Gynecology

## 2017-07-04 ENCOUNTER — Encounter
Admission: RE | Admit: 2017-07-04 | Discharge: 2017-07-04 | Disposition: A | Discharge status: Home / Self Care | Payer: Medicare Other | Source: Ambulatory Visit | Attending: Obstetrics & Gynecology | Admitting: Obstetrics & Gynecology

## 2017-07-04 ENCOUNTER — Ambulatory Visit (INDEPENDENT_AMBULATORY_CARE_PROVIDER_SITE_OTHER): Payer: Medicare Other | Admitting: Obstetrics & Gynecology

## 2017-07-04 VITALS — BP 140/90 | HR 78 | Ht 67.0 in | Wt 263.0 lb

## 2017-07-04 DIAGNOSIS — R9389 Abnormal findings on diagnostic imaging of other specified body structures: Secondary | ICD-10-CM | POA: Diagnosis not present

## 2017-07-04 DIAGNOSIS — R109 Unspecified abdominal pain: Secondary | ICD-10-CM | POA: Diagnosis not present

## 2017-07-04 HISTORY — DX: Asymptomatic human immunodeficiency virus (hiv) infection status: Z21

## 2017-07-04 HISTORY — DX: Personal history of other diseases of the digestive system: Z87.19

## 2017-07-04 HISTORY — DX: Human immunodeficiency virus (HIV) disease: B20

## 2017-07-04 HISTORY — DX: Hypothyroidism, unspecified: E03.9

## 2017-07-04 HISTORY — DX: Unspecified osteoarthritis, unspecified site: M19.90

## 2017-07-04 HISTORY — DX: Gastro-esophageal reflux disease without esophagitis: K21.9

## 2017-07-04 HISTORY — DX: Pneumonia, unspecified organism: J18.9

## 2017-07-04 NOTE — Patient Instructions (Signed)
  1.  Despues de la operacion, los ninos pueden aparentar como si tuvieran un poco de fiebre; su cara puede estar roja y su piel puede sentirse caliente.  La medicina administrada antes de la operacion es usualmente la causa de esto.    2.  Los medicamentos usados en la cirugia de hoy pueden hacer que su nino este sonoliento por el resto del dia.  Sin embargo, muchos ninos se sienten listos para reanudar sus actividades normales, dentro de algunas horas.    3. Por favor anime a su nino a que tome muchos liquidos hoy.  Poco a poco puede reanudar su alimentacion normal, segun el nino lo tolere.     4.  Por favor llame a su doctor inmediatamente, si su nino tiene un sangrado raro o inusual, problemas al respirar, fiebre, o si el dolor no se alivia con la medicina.    5.  Instrucciones especificas:    

## 2017-07-04 NOTE — Patient Instructions (Signed)
Your procedure is scheduled on: 07-08-17 TUESDAY Report to Same Day Surgery 2nd floor medical mall [facility] Entrance-take elevator on left to 2nd floor.  Check in with surgery information desk.) To find out your arrival time please call [phone] between 1PM - 3PM on 07-07-17 MONDAY  Remember: Instructions that are not followed completely may result in serious medical risk, up to and including death, or upon the discretion of your surgeon and anesthesiologist your surgery may need to be rescheduled.    _x___ 1. Do not eat food after midnight the night before your procedure. NO GUM CHEWING OR HARD CANDIES.  You may drink clear liquids up to 2 hours before you are scheduled to arrive at the hospital for your procedure.  Do not drink clear liquids within 2 hours of your scheduled arrival to the hospital.  Clear liquids include  --Water or Apple juice without pulp  --Clear carbohydrate beverage such as ClearFast or Gatorade  --Black Coffee or Clear Tea (No milk, no creamers, do not add anything to the coffee or Tea)  Type 1 and type 2 diabetics should only drink water.     __x__ 2. No Alcohol for 24 hours before or after surgery.   __x__3. No Smoking for 24 prior to surgery.   ____  4. Bring all medications with you on the day of surgery if instructed.    __x__ 5. Notify your doctor if there is any change in your medical condition     (cold, fever, infections).     Do not wear jewelry, make-up, hairpins, clips or nail polish.  Do not wear lotions, powders, or perfumes. You may wear deodorant.  Do not shave 48 hours prior to surgery. Men may shave face and neck.  Do not bring valuables to the hospital.    [facility] is not responsible for any belongings or valuables.               Contacts, dentures or bridgework may not be worn into surgery.  Leave your suitcase in the car. After surgery it may be brought to your room.  For patients admitted to the hospital, discharge time is  determined by your treatment team.   Patients discharged the day of surgery will not be allowed to drive home.  You will need someone to drive you home and stay with you the night of your procedure.    Please read over the following fact sheets that you were given:   [facility] Preparing for Surgery and or MRSA Information   _x___ TAKE THE FOLLOWING MEDICATIONS THE MORNING OF SURGERY WITH A SMALL SIP OF WATER. These include:  1. TRIUMEQ  2. SYNTHROID  3. METOPROLOL  4. OMEPRAZOLE  5. TAKE AN OMEPRAZOLE ON Monday NIGHT BEFORE BED  6.  ____Fleets enema or Magnesium Citrate as directed.   ____ Use CHG Soap or sage wipes as directed on instruction sheet   ____ Use inhalers on the day of surgery and bring to hospital day of surgery  __X__ Stop Metformin and Janumet 2 days prior to surgery-LAST DOSE OF METFORMIN ON Saturday, October 6TH.   ____ Take 1/2 of usual insulin dose the night before surgery and none on the morning surgery.   ____ Follow recommendations from Cardiologist, Pulmonologist or PCP regarding stopping Aspirin, Coumadin, Plavix ,Eliquis, Effient, or Pradaxa, and Pletal.  X____Stop Anti-inflammatories such as Advil, ALEVE, Ibuprofen, Motrin, Naproxen, Naprosyn, Goodies powders or aspirin products NOW-OK to take Tylenol   ____ Stop  supplements until after surgery.     ____ Bring C-Pap to the hospital.

## 2017-07-04 NOTE — Pre-Procedure Instructions (Signed)
EKG  EKG reviewed and interpreted by myself shows normal sinus rhythm at 76 bpm, narrow QRS, normal axis, normal intervals, no concerning ST changes.  ____________________________________________    RADIOLOGY  chest x-ray normal  ____________________________________________   INITIAL IMPRESSION / ASSESSMENT AND PLAN / ED COURSE  Pertinent labs & imaging results that were available during my care of the patient were reviewed by me and considered in my medical decision making (see chart for details).  patient presents to the emergency department for chest pain after eating a cheeseburger very quickly. Patient states 2 episodes of chest pain which are very brief, denies any nausea, diaphoresis or shortness of breath. Differential this time would include ACS, reflux, hiatal hernia related discomfort. Patient's labs including cardiac enzymes are normal. Chest x-ray is normal. Patient states she has belched several times which is abnormal for her. Symptoms are very suggestive of GI pathology. As the patient has been symptom free for almost 3 hours with a normal workup and a normal physical exam I believe the patient is safe for discharge home. However given the patient's age and comorbidities I discussed following up with a cardiologist for stress test. The patient is agreeable to this plan. I also discussed by normal chest pain return precautions.  ____________________________________________   FINAL CLINICAL IMPRESSION(S) / ED DIAGNOSES  chest pain    Minna Antis, MD 06/25/17 1539     Electronically signed by Minna Antis, MD at 06/25/2017 3:39 PM      ED on 06/25/2017        Detailed Report

## 2017-07-04 NOTE — Pre-Procedure Instructions (Signed)
CALLED DR Maisie Fus REGARDING PT WHO WAS IN ED LAST WEEK FOR CP-LABS, EKC AND CXR WNL BUT IN ED NOTE MD WANTED PT TO F/U WITH A CARDIOLOGIST FOR POSSIBLE STRESS TEST.  PT STATES SHE HAS NOT F/U WITH THIS.  DR Maisie Fus STATES THAT PT HAS TO BE CLEARED BY CARDIOLOGIST BEFORE SHE CAN PROCEED WITH D&C

## 2017-07-04 NOTE — Patient Instructions (Signed)
Hysteroscopy  Hysteroscopy is a procedure used for looking inside the womb (uterus). It may be done for various reasons, including:  · To evaluate abnormal bleeding, fibroid (benign, noncancerous) tumors, polyps, scar tissue (adhesions), and possibly cancer of the uterus.  · To look for lumps (tumors) and other uterine growths.  · To look for causes of why a woman cannot get pregnant (infertility), causes of recurrent loss of pregnancy (miscarriages), or a lost intrauterine device (IUD).  · To perform a sterilization by blocking the fallopian tubes from inside the uterus.    In this procedure, a thin, flexible tube with a tiny light and camera on the end of it (hysteroscope) is used to look inside the uterus. A hysteroscopy should be done right after a menstrual period to be sure you are not pregnant.  LET YOUR HEALTH CARE PROVIDER KNOW ABOUT:  · Any allergies you have.  · All medicines you are taking, including vitamins, herbs, eye drops, creams, and over-the-counter medicines.  · Previous problems you or members of your family have had with the use of anesthetics.  · Any blood disorders you have.  · Previous surgeries you have had.  · Medical conditions you have.  RISKS AND COMPLICATIONS  Generally, this is a safe procedure. However, as with any procedure, complications can occur. Possible complications include:  · Putting a hole in the uterus.  · Excessive bleeding.  · Infection.  · Damage to the cervix.  · Injury to other organs.  · Allergic reaction to medicines.  · Too much fluid used in the uterus for the procedure.    BEFORE THE PROCEDURE  · Ask your health care provider about changing or stopping any regular medicines.  · Do not take aspirin or blood thinners for 1 week before the procedure, or as directed by your health care provider. These can cause bleeding.  · If you smoke, do not smoke for 2 weeks before the procedure.  · In some cases, a medicine is placed in the cervix the day before the procedure.  This medicine makes the cervix have a larger opening (dilate). This makes it easier for the instrument to be inserted into the uterus during the procedure.  · Do not eat or drink anything for at least 8 hours before the surgery.  · Arrange for someone to take you home after the procedure.  PROCEDURE  · You may be given a medicine to relax you (sedative). You may also be given one of the following:  ? A medicine that numbs the area around the cervix (local anesthetic).  ? A medicine that makes you sleep through the procedure (general anesthetic).  · The hysteroscope is inserted through the vagina into the uterus. The camera on the hysteroscope sends a picture to a TV screen. This gives the surgeon a good view inside the uterus.  · During the procedure, air or a liquid is put into the uterus, which allows the surgeon to see better.  · Sometimes, tissue is gently scraped from inside the uterus. These tissue samples are sent to a lab for testing.  What to expect after the procedure  · If you had a general anesthetic, you may be groggy for a couple hours after the procedure.  · If you had a local anesthetic, you will be able to go home as soon as you are stable and feel ready.  · You may have some cramping. This normally lasts for a couple days.  · You may   have bleeding, which varies from light spotting for a few days to menstrual-like bleeding for 3-7 days. This is normal.  · If your test results are not back during the visit, make an appointment with your health care provider to find out the results.  This information is not intended to replace advice given to you by your health care provider. Make sure you discuss any questions you have with your health care provider.  Document Released: 12/23/2000 Document Revised: 02/22/2016 Document Reviewed: 04/15/2013  [employer]vier Interactive Patient Education © 2017 [employer]

## 2017-07-04 NOTE — Progress Notes (Signed)
PRE-OPERATIVE HISTORY AND PHYSICAL EXAM  HPI:  Misty Larsen is a 51 y.o. G0P0000 No LMP recorded. Patient is postmenopausal.; she is being admitted for surgery related to pelvic pain and abnormal ultrasound of endometrium despite being menoapusal for many years.  Obesity is a concern for effect on endometrium.  U/S revealed thickened EM of 18 mm. Pt hasn't had a period in 7-8 yrs and denies any vaginal bleeding, spotting. Pain is deep pelvis, no radiation, no associated sx's, no modifiers.  PMHx: Past Medical History:  Diagnosis Date  . Diabetes mellitus without complication (HCC)   . Hypertension    Past Surgical History:  Procedure Laterality Date  . COLONOSCOPY  2014   polyps benign  . CRYOTHERAPY  2013-2014   Family History  Problem Relation Age of Onset  . Breast cancer Mother    Social History  Substance Use Topics  . Smoking status: Former Smoker    Packs/day: 0.50    Years: 41.00    Types: Cigarettes    Start date: 12/30/1975    Quit date: 01/05/2017  . Smokeless tobacco: Never Used  . Alcohol use Yes     Comment: Occasional    Current Outpatient Prescriptions:  .  abacavir-dolutegravir-lamiVUDine (TRIUMEQ) 600-50-300 MG tablet, Take 1 tablet by mouth daily., Disp: , Rfl:  .  levothyroxine (SYNTHROID, LEVOTHROID) 50 MCG tablet, Take 50 mcg by mouth daily before breakfast., Disp: , Rfl:  .  metFORMIN (GLUCOPHAGE) 500 MG tablet, Take 250 mg by mouth 2 (two) times daily with a meal., Disp: , Rfl:  .  metoprolol tartrate (LOPRESSOR) 50 MG tablet, Take 50 mg by mouth 2 (two) times daily., Disp: , Rfl:  .  omeprazole (PRILOSEC) 20 MG capsule, Take 20 mg by mouth 2 (two) times daily before a meal., Disp: , Rfl:  .  rosuvastatin (CRESTOR) 5 MG tablet, Take 5 mg by mouth at bedtime. , Disp: , Rfl:  Allergies: Sulfa antibiotics  Review of Systems  Constitutional: Negative for chills, fever and malaise/fatigue.  HENT: Negative for congestion, sinus pain and sore throat.     Eyes: Negative for blurred vision and pain.  Respiratory: Negative for cough and wheezing.   Cardiovascular: Negative for chest pain and leg swelling.  Gastrointestinal: Negative for abdominal pain, constipation, diarrhea, heartburn, nausea and vomiting.  Genitourinary: Negative for dysuria, frequency, hematuria and urgency.  Musculoskeletal: Negative for back pain, joint pain, myalgias and neck pain.  Skin: Negative for itching and rash.  Neurological: Negative for dizziness, tremors and weakness.  Endo/Heme/Allergies: Does not bruise/bleed easily.  Psychiatric/Behavioral: Negative for depression. The patient is not nervous/anxious and does not have insomnia.     Objective: BP 140/90   Pulse 78   Ht  (1.702 m)   Wt 263 lb (119.3 kg)   BMI 41.19 kg/m   Filed Weights   07/04/17 0849  Weight: 263 lb (119.3 kg)   Physical Exam  Constitutional: She is oriented to person, place, and time. She appears well-developed and well-nourished. No distress.  Genitourinary: Rectum normal and vagina normal. Pelvic exam was performed with patient supine. There is no rash or lesion on the right labia. There is no rash or lesion on the left labia. No bleeding in the vagina.  Genitourinary Comments: Limited exam due to pain on exam and inability to use speculum  HENT:  Head: Normocephalic and atraumatic. Head is without laceration.  Right Ear: Hearing normal.  Left Ear: Hearing normal.  Nose: No  epistaxis.  No foreign bodies.  Mouth/Throat: Uvula is midline, oropharynx is clear and moist and mucous membranes are normal.  Eyes: Pupils are equal, round, and reactive to light.  Neck: Normal range of motion. Neck supple. No thyromegaly present.  Cardiovascular: Normal rate and regular rhythm.  Exam reveals no gallop and no friction rub.   No murmur heard. Pulmonary/Chest: Effort normal and breath sounds normal. No respiratory distress. She has no wheezes. Right breast exhibits no mass, no skin  change and no tenderness. Left breast exhibits no mass, no skin change and no tenderness.  Abdominal: Soft. Bowel sounds are normal. She exhibits no distension. There is no tenderness. There is no rebound.  Musculoskeletal: Normal range of motion.  Neurological: She is alert and oriented to person, place, and time. No cranial nerve deficit.  Skin: Skin is warm and dry.  Psychiatric: She has a normal mood and affect. Judgment normal.  Vitals reviewed.   Assessment: 1. Endometrial thickening on ultrasound   2. Abdominal pain, unspecified abdominal location   Plan hyst D&C to better evaluate. Unable to perform EMB in office due to pt discomfort with speculum exam.  I have had a careful discussion with this patient about all the options available and the risk/benefits of each. I have fully informed this patient that surgery may subject her to a variety of discomforts and risks: She understands that most patients have surgery with little difficulty, but problems can happen ranging from minor to fatal. These include nausea, vomiting, pain, bleeding, infection, poor healing, hernia, or formation of adhesions. Unexpected reactions may occur from any drug or anesthetic given. Unintended injury may occur to other pelvic or abdominal structures such as Fallopian tubes, ovaries, bladder, ureter (tube from kidney to bladder), or bowel. Nerves going from the pelvis to the legs may be injured. Any such injury may require immediate or later additional surgery to correct the problem. Excessive blood loss requiring transfusion is very unlikely but possible. Dangerous blood clots may form in the legs or lungs. Physical and sexual activity will be restricted in varying degrees for an indeterminate period of time but most often 2-6 weeks.  Finally, she understands that it is impossible to list every possible undesirable effect and that the condition for which surgery is done is not always cured or significantly improved,  and in rare cases may be even worse.Ample time was given to answer all questions.  Annamarie Major, MD, [facility], [facility] 07/04/2017  9:13 AM

## 2017-07-04 NOTE — Pre-Procedure Instructions (Signed)
SPOKE WITH NANCY AT [facility] REGARDING PT NEEDING CARDIAC CLEARANCE.  NANCY TO LET DR HARRIS KNOW AND SHE WILL GET PT SET UP WITH A CARDIOLOGIST-FAXED CLEARANCE PAPERWORK OVER TO NANCY WITH FAX CONFIRMATION RECEIVED

## 2017-07-08 ENCOUNTER — Encounter: Admission: RE | Payer: Self-pay | Source: Ambulatory Visit

## 2017-07-08 ENCOUNTER — Ambulatory Visit
Admission: RE | Admit: 2017-07-08 | Payer: Medicare Other | Source: Ambulatory Visit | Admitting: Obstetrics & Gynecology

## 2017-07-08 DIAGNOSIS — E7800 Pure hypercholesterolemia, unspecified: Secondary | ICD-10-CM | POA: Diagnosis not present

## 2017-07-08 DIAGNOSIS — I10 Essential (primary) hypertension: Secondary | ICD-10-CM | POA: Diagnosis not present

## 2017-07-08 DIAGNOSIS — R0789 Other chest pain: Secondary | ICD-10-CM | POA: Diagnosis not present

## 2017-07-08 SURGERY — DILATATION AND CURETTAGE /HYSTEROSCOPY
Anesthesia: Choice

## 2017-07-09 NOTE — Addendum Note (Signed)
Addended by: Nadara Mustard on: 07/09/2017 05:12 PM   Modules accepted: Orders, SmartSet

## 2017-07-17 DIAGNOSIS — N8500 Endometrial hyperplasia, unspecified: Secondary | ICD-10-CM | POA: Diagnosis not present

## 2017-07-17 DIAGNOSIS — B20 Human immunodeficiency virus [HIV] disease: Secondary | ICD-10-CM | POA: Diagnosis not present

## 2017-07-17 DIAGNOSIS — I10 Essential (primary) hypertension: Secondary | ICD-10-CM | POA: Diagnosis not present

## 2017-07-17 DIAGNOSIS — E669 Obesity, unspecified: Secondary | ICD-10-CM | POA: Diagnosis not present

## 2017-07-17 DIAGNOSIS — E1165 Type 2 diabetes mellitus with hyperglycemia: Secondary | ICD-10-CM | POA: Diagnosis not present

## 2017-08-04 ENCOUNTER — Encounter
Admission: RE | Admit: 2017-08-04 | Discharge: 2017-08-04 | Disposition: A | Discharge status: Home / Self Care | Payer: Medicare Other | Source: Ambulatory Visit | Attending: Obstetrics & Gynecology | Admitting: Obstetrics & Gynecology

## 2017-08-04 ENCOUNTER — Encounter: Payer: Medicare Other | Admitting: Obstetrics & Gynecology

## 2017-08-04 NOTE — Patient Instructions (Signed)
  Your procedure is scheduled on: 08-12-17 TUESDAY Report to Same Day Surgery 2nd floor medical mall [facility](Medical Mall Entrance-take elevator on left to 2nd floor.  Check in with surgery information desk.) To find out your arrival time please call [phone] between 1PM - 3PM on 08-11-17 MONDAY  Remember: Instructions that are not followed completely may result in serious medical risk, up to and including death, or upon the discretion of your surgeon and anesthesiologist your surgery may need to be rescheduled.    _x___ 1. Do not eat food after midnight the night before your procedure. NO GUM CHEWING OR CANDY AFTER MIDNIGHT.  You may drink WATER up to 2 hours before you are scheduled to arrive at the hospital for your procedure.  Do not drink clear liquids within 2 hours of your scheduled arrival to the hospital.  Type 1 and type 2 diabetics should only drink water.     __x__ 2. No Alcohol for 24 hours before or after surgery.   __x__3. No Smoking for 24 prior to surgery.   ____  4. Bring all medications with you on the day of surgery if instructed.    __x__ 5. Notify your doctor if there is any change in your medical condition     (cold, fever, infections).     Do not wear jewelry, make-up, hairpins, clips or nail polish.  Do not wear lotions, powders, or perfumes. You may wear deodorant.  Do not shave 48 hours prior to surgery. Men may shave face and neck.  Do not bring valuables to the hospital.    [facility] is not responsible for any belongings or valuables.               Contacts, dentures or bridgework may not be worn into surgery.  Leave your suitcase in the car. After surgery it may be brought to your room.  For patients admitted to the hospital, discharge time is determined by your treatment team.   Patients discharged the day of surgery will not be allowed to drive home.  You will need someone to drive you home and stay with you the night of your procedure.    Please read  over the following fact sheets that you were given:    _x___ TAKE THE FOLLOWING MEDICATIONS THE MORNING OF SURGERY WITH A SMALL SIP OF WATER. These include:  1. TRIUMEQ  2. LEVOTHYROXINE  3. METOPROLOL  4. PRILOSEC  5. TAKE AN EXTRA PRILOSEC Monday NIGHT BEFORE BED  6.  ____Fleets enema or Magnesium Citrate as directed.   ____ Use CHG Soap or sage wipes as directed on instruction sheet   ____ Use inhalers on the day of surgery and bring to hospital day of surgery  _X___ Stop Metformin and Janumet 2 days prior to surgery-LAST DOSE OF METFORMIN ON Saturday, November 10TH    ____ Take 1/2 of usual insulin dose the night before surgery and none on the morning surgery.   ____ Follow recommendations from Cardiologist, Pulmonologist or PCP regarding stopping Aspirin, Coumadin, Plavix ,Eliquis, Effient, or Pradaxa, and Pletal.  X____Stop Anti-inflammatories such as Advil, ALEVE, Ibuprofen, Motrin, Naproxen, Naprosyn, Goodies powders or aspirin products NOW-OK to take Tylenol    ____ Stop supplements until after surgery.    ____ Bring C-Pap to the hospital.

## 2017-08-05 ENCOUNTER — Ambulatory Visit (INDEPENDENT_AMBULATORY_CARE_PROVIDER_SITE_OTHER): Payer: Medicare Other | Admitting: Obstetrics & Gynecology

## 2017-08-05 ENCOUNTER — Encounter: Payer: Self-pay | Admitting: Obstetrics & Gynecology

## 2017-08-05 VITALS — BP 140/98 | HR 83 | Ht 67.0 in | Wt 259.0 lb

## 2017-08-05 DIAGNOSIS — E039 Hypothyroidism, unspecified: Secondary | ICD-10-CM | POA: Diagnosis not present

## 2017-08-05 DIAGNOSIS — Z01818 Encounter for other preprocedural examination: Secondary | ICD-10-CM

## 2017-08-05 DIAGNOSIS — N958 Other specified menopausal and perimenopausal disorders: Secondary | ICD-10-CM | POA: Diagnosis not present

## 2017-08-05 DIAGNOSIS — E669 Obesity, unspecified: Secondary | ICD-10-CM

## 2017-08-05 DIAGNOSIS — R102 Pelvic and perineal pain: Secondary | ICD-10-CM

## 2017-08-05 DIAGNOSIS — B20 Human immunodeficiency virus [HIV] disease: Secondary | ICD-10-CM | POA: Diagnosis not present

## 2017-08-05 DIAGNOSIS — R9389 Abnormal findings on diagnostic imaging of other specified body structures: Secondary | ICD-10-CM

## 2017-08-05 DIAGNOSIS — E119 Type 2 diabetes mellitus without complications: Secondary | ICD-10-CM | POA: Diagnosis not present

## 2017-08-05 DIAGNOSIS — I10 Essential (primary) hypertension: Secondary | ICD-10-CM

## 2017-08-05 NOTE — Progress Notes (Signed)
PRE-OPERATIVE HISTORY AND PHYSICAL EXAM  HPI:  Misty Larsen is a 51 y.o. G0P0000 Patient's last menstrual period was 07/05/2011 (approximate).; she is being admitted for surgery related to abnormal thickening of endometrium.  Patient is postmenopausal.; she is being admitted for surgery related to pelvic pain and abnormal ultrasound of endometrium despite being menoapusal for many years.  Obesity is a concern for effect on endometrium.  U/S revealed thickened EM of 18 mm. Pt hasn't had a period in 7-8 yrs and denies any vaginal bleeding, spotting. Pain is deep pelvis, no radiation, no associated sx's, no modifiers.  PMHx: Past Medical History:  Diagnosis Date  . Arthritis    FEET  . Diabetes mellitus without complication (HCC)   . GERD (gastroesophageal reflux disease)   . History of hiatal hernia    SMALL PER PT  . HIV (human immunodeficiency virus infection) (HCC)   . Hypertension   . Hypothyroidism   . Pneumonia 2007   Past Surgical History:  Procedure Laterality Date  . BIOPSY THYROID    . BREAST SURGERY     BIOPSY  . CARPAL TUNNEL RELEASE  2012  . CERVIX SURGERY  2013  . COLONOSCOPY  2014   polyps benign  . CRYOTHERAPY  2013-2014  . JOINT REPLACEMENT Left 2011   HIP   Family History  Problem Relation Age of Onset  . Breast cancer Mother    Social History   Tobacco Use  . Smoking status: Former Smoker    Packs/day: 0.50    Years: 41.00    Pack years: 20.50    Types: Cigarettes    Start date: 12/30/1975    Last attempt to quit: 01/05/2017    Years since quitting: 0.5  . Smokeless tobacco: Never Used  Substance Use Topics  . Alcohol use: Yes    Comment: Occasional  . Drug use: No    Comment: PT STATES SHE HAS BEEN CLEAN FOR 6 YEARS NOW     Current Outpatient Medications:  .  abacavir-dolutegravir-lamiVUDine (TRIUMEQ) 600-50-300 MG tablet, Take 1 tablet by mouth every morning. , Disp: , Rfl:  .  levothyroxine (SYNTHROID, LEVOTHROID) 50 MCG tablet, Take 50  mcg by mouth daily before breakfast., Disp: , Rfl:  .  losartan (COZAAR) 100 MG tablet, Take 100 mg every morning by mouth. , Disp: , Rfl:  .  metFORMIN (GLUCOPHAGE) 500 MG tablet, Take 250 mg by mouth 2 (two) times daily with a meal., Disp: , Rfl:  .  metoprolol tartrate (LOPRESSOR) 50 MG tablet, Take 50 mg by mouth 2 (two) times daily., Disp: , Rfl:  .  naproxen sodium (ALEVE) 220 MG tablet, Take 220 mg by mouth 2 (two) times daily as needed. , Disp: , Rfl:  .  omeprazole (PRILOSEC) 20 MG capsule, Take 20 mg by mouth daily as needed. , Disp: , Rfl:  .  rosuvastatin (CRESTOR) 5 MG tablet, Take 5 mg by mouth at bedtime. , Disp: , Rfl:  Allergies: Sulfa antibiotics  Review of Systems  Constitutional: Negative for chills, fever and malaise/fatigue.  HENT: Negative for congestion, sinus pain and sore throat.   Eyes: Negative for blurred vision and pain.  Respiratory: Negative for cough and wheezing.   Cardiovascular: Negative for chest pain and leg swelling.  Gastrointestinal: Negative for abdominal pain, constipation, diarrhea, heartburn, nausea and vomiting.  Genitourinary: Negative for dysuria, frequency, hematuria and urgency.  Musculoskeletal: Negative for back pain, joint pain, myalgias and neck pain.  Skin: Negative  for itching and rash.  Neurological: Negative for dizziness, tremors and weakness.  Endo/Heme/Allergies: Does not bruise/bleed easily.  Psychiatric/Behavioral: Negative for depression. The patient is not nervous/anxious and does not have insomnia.     Objective: BP (!) 140/98   Pulse 83   Ht 5\' 7"  (1.702 m)   Wt 259 lb (117.5 kg)   LMP 07/05/2011 (Approximate)   BMI 40.57 kg/m   Filed Weights   08/05/17 1050  Weight: 259 lb (117.5 kg)   Physical Exam  Constitutional: She is oriented to person, place, and time. She appears well-developed and well-nourished. No distress.  Genitourinary: Rectum normal, vagina normal and uterus normal. Pelvic exam was performed with  patient supine. There is no rash or lesion on the right labia. There is no rash or lesion on the left labia. Vagina exhibits no lesion. No bleeding in the vagina. Right adnexum does not display mass and does not display tenderness. Left adnexum does not display mass and does not display tenderness. Cervix does not exhibit motion tenderness, lesion, friability or polyp.   Uterus is mobile and midaxial. Uterus is not enlarged or exhibiting a mass.  Genitourinary Comments: Difficult exam due to obesity  HENT:  Head: Normocephalic and atraumatic. Head is without laceration.  Right Ear: Hearing normal.  Left Ear: Hearing normal.  Nose: No epistaxis.  No foreign bodies.  Mouth/Throat: Uvula is midline, oropharynx is clear and moist and mucous membranes are normal.  Eyes: Pupils are equal, round, and reactive to light.  Neck: Normal range of motion. Neck supple. No thyromegaly present.  Cardiovascular: Normal rate and regular rhythm. Exam reveals no gallop and no friction rub.  No murmur heard. Pulmonary/Chest: Effort normal and breath sounds normal. No respiratory distress. She has no wheezes. Right breast exhibits no mass, no skin change and no tenderness. Left breast exhibits no mass, no skin change and no tenderness.  Abdominal: Soft. Bowel sounds are normal. She exhibits no distension. There is no tenderness. There is no rebound.  Musculoskeletal: Normal range of motion.  Neurological: She is alert and oriented to person, place, and time. No cranial nerve deficit.  Skin: Skin is warm and dry.  Psychiatric: She has a normal mood and affect. Judgment normal.  Vitals reviewed.   Assessment: 1. Endometrial thickening on ultrasound   Hyst D&C,  Unable to successfully perform office EMB.  I have had a careful discussion with this patient about all the options available and the risk/benefits of each. I have fully informed this patient that surgery may subject her to a variety of discomforts and  risks: She understands that most patients have surgery with little difficulty, but problems can happen ranging from minor to fatal. These include nausea, vomiting, pain, bleeding, infection, poor healing, hernia, or formation of adhesions. Unexpected reactions may occur from any drug or anesthetic given. Unintended injury may occur to other pelvic or abdominal structures such as Fallopian tubes, ovaries, bladder, ureter (tube from kidney to bladder), or bowel. Nerves going from the pelvis to the legs may be injured. Any such injury may require immediate or later additional surgery to correct the problem. Excessive blood loss requiring transfusion is very unlikely but possible. Dangerous blood clots may form in the legs or lungs. Physical and sexual activity will be restricted in varying degrees for an indeterminate period of time but most often 2-6 weeks.  Finally, she understands that it is impossible to list every possible undesirable effect and that the condition for which surgery is  done is not always cured or significantly improved, and in rare cases may be even worse.Ample time was given to answer all questions.  Annamarie MajorPaul Marki Frede, MD, [facility], [facility] 08/05/2017  11:08 AM

## 2017-08-05 NOTE — Patient Instructions (Signed)
Hysteroscopy, Care After  Refer to this sheet in the next few weeks. These instructions provide you with information on caring for yourself after your procedure. Your health care provider may also give you more specific instructions. Your treatment has been planned according to current medical practices, but problems sometimes occur. Call your health care provider if you have any problems or questions after your procedure.  What can I expect after the procedure?  After your procedure, it is typical to have the following:  · You may have some cramping. This normally lasts for a couple days.  · You may have bleeding. This can vary from light spotting for a few days to menstrual-like bleeding for 3-7 days.    Follow these instructions at home:  · Rest for the first 1-2 days after the procedure.  · Only take over-the-counter or prescription medicines as directed by your health care provider. Do not take aspirin. It can increase the chances of bleeding.  · Take showers instead of baths for 2 weeks or as directed by your health care provider.  · Do not drive for 24 hours or as directed.  · Do not drink alcohol while taking pain medicine.  · Do not use tampons, douche, or have sexual intercourse for 2 weeks or until your health care provider says it is okay.  · Take your temperature twice a day for 4-5 days. Write it down each time.  · Follow your health care provider's advice about diet, exercise, and lifting.  · If you develop constipation, you may:  ? Take a mild laxative if your health care provider approves.  ? Add bran foods to your diet.  ? Drink enough fluids to keep your urine clear or pale yellow.  · Try to have someone with you or available to you for the first 24-48 hours, especially if you were given a general anesthetic.  · Follow up with your health care provider as directed.  Contact a health care provider if:  · You feel dizzy or lightheaded.  · You feel sick to your stomach (nauseous).  · You have  abnormal vaginal discharge.  · You have a rash.  · You have pain that is not controlled with medicine.  Get help right away if:  · You have bleeding that is heavier than a normal menstrual period.  · You have a fever.  · You have increasing cramps or pain, not controlled with medicine.  · You have new belly (abdominal) pain.  · You pass out.  · You have pain in the tops of your shoulders (shoulder strap areas).  · You have shortness of breath.  This information is not intended to replace advice given to you by your health care provider. Make sure you discuss any questions you have with your health care provider.  Document Released: 07/07/2013 Document Revised: 02/22/2016 Document Reviewed: 04/15/2013  [employer]vier Interactive Patient Education © 2017 [employer]

## 2017-08-08 ENCOUNTER — Encounter
Admission: RE | Admit: 2017-08-08 | Discharge: 2017-08-08 | Disposition: A | Discharge status: Home / Self Care | Payer: Medicare Other | Source: Ambulatory Visit | Attending: Obstetrics & Gynecology | Admitting: Obstetrics & Gynecology

## 2017-08-08 DIAGNOSIS — E119 Type 2 diabetes mellitus without complications: Secondary | ICD-10-CM | POA: Diagnosis not present

## 2017-08-08 DIAGNOSIS — Z01812 Encounter for preprocedural laboratory examination: Secondary | ICD-10-CM | POA: Insufficient documentation

## 2017-08-08 DIAGNOSIS — K449 Diaphragmatic hernia without obstruction or gangrene: Secondary | ICD-10-CM | POA: Insufficient documentation

## 2017-08-08 DIAGNOSIS — E039 Hypothyroidism, unspecified: Secondary | ICD-10-CM | POA: Insufficient documentation

## 2017-08-08 DIAGNOSIS — K219 Gastro-esophageal reflux disease without esophagitis: Secondary | ICD-10-CM | POA: Diagnosis not present

## 2017-08-08 DIAGNOSIS — I10 Essential (primary) hypertension: Secondary | ICD-10-CM | POA: Diagnosis not present

## 2017-08-08 DIAGNOSIS — Z7984 Long term (current) use of oral hypoglycemic drugs: Secondary | ICD-10-CM | POA: Insufficient documentation

## 2017-08-08 DIAGNOSIS — Z8701 Personal history of pneumonia (recurrent): Secondary | ICD-10-CM | POA: Insufficient documentation

## 2017-08-08 DIAGNOSIS — Z87891 Personal history of nicotine dependence: Secondary | ICD-10-CM | POA: Insufficient documentation

## 2017-08-08 LAB — CBC
HEMATOCRIT: 46.3 % (ref 35.0–47.0)
Hemoglobin: 15 g/dL (ref 12.0–16.0)
MCH: 27.7 pg (ref 26.0–34.0)
MCHC: 32.4 g/dL (ref 32.0–36.0)
MCV: 85.5 fL (ref 80.0–100.0)
PLATELETS: 231 10*3/uL (ref 150–440)
RBC: 5.41 MIL/uL — ABNORMAL HIGH (ref 3.80–5.20)
RDW: 14.4 % (ref 11.5–14.5)
WBC: 9.4 10*3/uL (ref 3.6–11.0)

## 2017-08-08 LAB — TYPE AND SCREEN
ABO/RH(D): O POS
Antibody Screen: NEGATIVE

## 2017-08-08 NOTE — Pre-Procedure Instructions (Signed)
Denton ArFath, Kenneth Alan, MD - 07/08/2017 9:30 AM EDT Formatting of this note might be different from the original.   Chief Complaint: Chief Complaint  Patient presents with  . Pre-op Exam  cardiac clearance  . Establish Care  Date of Service: 07/08/2017 Date of Birth: April 13, 1966 PCP: Lyndon CodeKhan, Fozia M, MD  History of Present Illness: [name] is a 51 y.o.female patient who presents for evaluation and preoperative risk assessment. She has a history of hyperlipidemia hypertension. Also has some dyspnea on exertion. Her baseline electrocardiogram shows sinus rhythm with nonspecific ST-T wave changes. She was evaluated with a chest x-ray in September in the emergency room which revealed no edema. She has endometrial thickening and is being evaluated for hysteroscopy and probable D&C.  Past Medical and Surgical History  Past Medical History Past Medical History:  Diagnosis Date  . GERD (gastroesophageal reflux disease)  . HIV (human immunodeficiency virus infection) (CMS-HCC)  . Hyperlipidemia, unspecified  . Hypertension  . Hypothyroidism (acquired), unspecified  . Obesity, unspecified   Past Surgical History She has no past surgical history on file.   Medications and Allergies  Current Medications  Current Outpatient Prescriptions  Medication Sig Dispense Refill  . abacavir-dolutegravir-lamiVUDine (TRIUMEQ) 600-50-300 mg tablet Take 1 tablet by mouth once daily.  Marland Kitchen. levothyroxine (SYNTHROID, LEVOTHROID) 50 MCG tablet Take 50 mcg by mouth once daily. Take on an empty stomach with a glass of water at least 30-60 minutes before breakfast.  . metFORMIN (GLUCOPHAGE) 500 MG tablet Take 1 tablet by mouth 2 (two) times daily.  . metoprolol tartrate (LOPRESSOR) 50 MG tablet Take 50 mg by mouth 2 (two) times daily.  Marland Kitchen. omeprazole (PRILOSEC) 40 MG DR capsule Take 40 mg by mouth 2 (two) times daily.  . rosuvastatin (CRESTOR) 5 MG tablet Take 5 mg by mouth once daily.  Marland Kitchen. losartan (COZAAR) 100 MG  tablet Take 1 tablet (100 mg total) by mouth once daily. 30 tablet 11   No current facility-administered medications for this visit.   Allergies: Sulfa (sulfonamide antibiotics)  Social and Family History  Social History reports that she quit smoking about 6 months ago. She has never used smokeless tobacco. She reports that she drinks alcohol. She reports that she does not use drugs.  Family History History reviewed. No pertinent family history.  Review of Systems  Review of Systems  Constitutional: Negative for chills, diaphoresis, fever, malaise/fatigue and weight loss.  HENT: Negative for congestion, ear discharge, hearing loss and tinnitus.  Eyes: Negative for blurred vision.  Respiratory: Negative for cough, hemoptysis, sputum production, shortness of breath and wheezing.  Cardiovascular: Negative for chest pain, palpitations, orthopnea, claudication, leg swelling and PND.  Gastrointestinal: Negative for abdominal pain, blood in stool, constipation, diarrhea, heartburn, melena, nausea and vomiting.  Genitourinary: Negative for dysuria, frequency, hematuria and urgency.  Musculoskeletal: Negative for back pain, falls, joint pain and myalgias.  Skin: Negative for itching and rash.  Neurological: Negative for dizziness, tingling, focal weakness, loss of consciousness, weakness and headaches.  Endo/Heme/Allergies: Negative for polydipsia. Does not bruise/bleed easily.  Psychiatric/Behavioral: Negative for depression, memory loss and substance abuse. The patient is not nervous/anxious.   Physical Examination   Vitals:BP (!) 150/98  Pulse 80  Resp 12  Ht 170.2 cm (5\' 7" )  Wt (!) 117.5 kg (259 lb)  BMI 40.57 kg/m  Ht:170.2 cm (5\' 7" ) Wt:(!) 117.5 kg (259 lb) AVW:UJWJBSA:Body surface area is 2.36 meters squared. Body mass index is 40.57 kg/m.  Wt Readings from Last 3 Encounters:  07/08/17 (!) 117.5 kg (259 lb)   BP Readings from Last 3 Encounters:  07/08/17 (!) 150/98   General  appearance appears in no acute distress  Head Mouth and Eye exam Normocephalic, without obvious abnormality, atraumatic Dentition is good Eyes appear anicteric   Neck exam Thyroid: normal  Nodes: no obvious adenopathy  LUNGS Breath Sounds: Normal Percussion: Normal  CARDIOVASCULAR JVP CV wave: no HJR: no Elevation at 90 degrees: None Carotid Pulse: normal pulsation bilaterally Bruit: None Apex: apical impulse normal  Auscultation Rhythm: normal sinus rhythm S1: normal S2: normal Clicks: no Rub: no Murmurs: no murmurs  Gallop: None ABDOMEN Liver enlargement: no Pulsatile aorta: no Ascites: no Bruits: no  EXTREMITIES Clubbing: no Edema: trace to 1+ bilateral pedal edema Pulses: peripheral pulses symmetrical Femoral Bruits: no Amputation: no SKIN Rash: no Cyanosis: no Embolic phemonenon: no Bruising: no NEURO Alert and Oriented to person, place and time: yes Non focal: yes  PSYCH: Pt appears to have normal affect  LABS REVIEWED Last 3 CBC results: No results found for: WBC No results found for: HGB No results found for: HCT  No results found for: PLT  No results found for: CREATININE, BUN, NA, K, CL, CO2  No results found for: HGBA1C  No results found for: HDL No results found for: LDLCALC No results found for: TRIG  No results found for: ALT, AST, GGT, ALKPHOS, BILITOT  No results found for: TSH  Diagnostic Studies Reviewed:  EKG EKG demonstrated normal sinus rhythm, nonspecific ST and T waves changes.  Assessment and Plan   51 y.o. female with  ICD-10-CM ICD-9-CM  1. Atypical chest pain-we will risk stratify with an ETT. Further recommendations after this is complete. R07.89 786.59 CARD stress test only, exercise  2. Hypercholesteremia-continue with Crestor at 5 mg daily and a low-cholesterol diet. LDL goal of less than 130 is recommended. E78.00 272.0  3. Essential hypertension-continue with metoprolol 50 mg daily and losartan  100 mg daily. Weight loss and DASH diet recommended. I10 401.9   Return if symptoms worsen or fail to improve.  These notes generated with voice recognition software. I apologize for typographical errors.  Denton ArKENNETH ALAN FATH, MD    Electronically Signed by Denton ArFath, Kenneth Alan, MD on 07/08/2017 9:30 AM EDT

## 2017-08-11 NOTE — Pre-Procedure Instructions (Signed)
Denton ArFath, Kenneth Alan, MD - 07/08/2017 9:30 AM EDT Formatting of this note might be different from the original.   Chief Complaint: Chief Complaint  Patient presents with  . Pre-op Exam  cardiac clearance  . Establish Care  Date of Service: 07/08/2017 Date of Birth: April 13, 1966 PCP: Lyndon CodeKhan, Fozia M, MD  History of Present Illness: [name] is a 51 y.o.female patient who presents for evaluation and preoperative risk assessment. She has a history of hyperlipidemia hypertension. Also has some dyspnea on exertion. Her baseline electrocardiogram shows sinus rhythm with nonspecific ST-T wave changes. She was evaluated with a chest x-ray in September in the emergency room which revealed no edema. She has endometrial thickening and is being evaluated for hysteroscopy and probable D&C.  Past Medical and Surgical History  Past Medical History Past Medical History:  Diagnosis Date  . GERD (gastroesophageal reflux disease)  . HIV (human immunodeficiency virus infection) (CMS-HCC)  . Hyperlipidemia, unspecified  . Hypertension  . Hypothyroidism (acquired), unspecified  . Obesity, unspecified   Past Surgical History She has no past surgical history on file.   Medications and Allergies  Current Medications  Current Outpatient Prescriptions  Medication Sig Dispense Refill  . abacavir-dolutegravir-lamiVUDine (TRIUMEQ) 600-50-300 mg tablet Take 1 tablet by mouth once daily.  Marland Kitchen. levothyroxine (SYNTHROID, LEVOTHROID) 50 MCG tablet Take 50 mcg by mouth once daily. Take on an empty stomach with a glass of water at least 30-60 minutes before breakfast.  . metFORMIN (GLUCOPHAGE) 500 MG tablet Take 1 tablet by mouth 2 (two) times daily.  . metoprolol tartrate (LOPRESSOR) 50 MG tablet Take 50 mg by mouth 2 (two) times daily.  Marland Kitchen. omeprazole (PRILOSEC) 40 MG DR capsule Take 40 mg by mouth 2 (two) times daily.  . rosuvastatin (CRESTOR) 5 MG tablet Take 5 mg by mouth once daily.  Marland Kitchen. losartan (COZAAR) 100 MG  tablet Take 1 tablet (100 mg total) by mouth once daily. 30 tablet 11   No current facility-administered medications for this visit.   Allergies: Sulfa (sulfonamide antibiotics)  Social and Family History  Social History reports that she quit smoking about 6 months ago. She has never used smokeless tobacco. She reports that she drinks alcohol. She reports that she does not use drugs.  Family History History reviewed. No pertinent family history.  Review of Systems  Review of Systems  Constitutional: Negative for chills, diaphoresis, fever, malaise/fatigue and weight loss.  HENT: Negative for congestion, ear discharge, hearing loss and tinnitus.  Eyes: Negative for blurred vision.  Respiratory: Negative for cough, hemoptysis, sputum production, shortness of breath and wheezing.  Cardiovascular: Negative for chest pain, palpitations, orthopnea, claudication, leg swelling and PND.  Gastrointestinal: Negative for abdominal pain, blood in stool, constipation, diarrhea, heartburn, melena, nausea and vomiting.  Genitourinary: Negative for dysuria, frequency, hematuria and urgency.  Musculoskeletal: Negative for back pain, falls, joint pain and myalgias.  Skin: Negative for itching and rash.  Neurological: Negative for dizziness, tingling, focal weakness, loss of consciousness, weakness and headaches.  Endo/Heme/Allergies: Negative for polydipsia. Does not bruise/bleed easily.  Psychiatric/Behavioral: Negative for depression, memory loss and substance abuse. The patient is not nervous/anxious.   Physical Examination   Vitals:BP (!) 150/98  Pulse 80  Resp 12  Ht 170.2 cm (5\' 7" )  Wt (!) 117.5 kg (259 lb)  BMI 40.57 kg/m  Ht:170.2 cm (5\' 7" ) Wt:(!) 117.5 kg (259 lb) AVW:UJWJBSA:Body surface area is 2.36 meters squared. Body mass index is 40.57 kg/m.  Wt Readings from Last 3 Encounters:  07/08/17 (!) 117.5 kg (259 lb)   BP Readings from Last 3 Encounters:  07/08/17 (!) 150/98   General  appearance appears in no acute distress  Head Mouth and Eye exam Normocephalic, without obvious abnormality, atraumatic Dentition is good Eyes appear anicteric   Neck exam Thyroid: normal  Nodes: no obvious adenopathy  LUNGS Breath Sounds: Normal Percussion: Normal  CARDIOVASCULAR JVP CV wave: no HJR: no Elevation at 90 degrees: None Carotid Pulse: normal pulsation bilaterally Bruit: None Apex: apical impulse normal  Auscultation Rhythm: normal sinus rhythm S1: normal S2: normal Clicks: no Rub: no Murmurs: no murmurs  Gallop: None ABDOMEN Liver enlargement: no Pulsatile aorta: no Ascites: no Bruits: no  EXTREMITIES Clubbing: no Edema: trace to 1+ bilateral pedal edema Pulses: peripheral pulses symmetrical Femoral Bruits: no Amputation: no SKIN Rash: no Cyanosis: no Embolic phemonenon: no Bruising: no NEURO Alert and Oriented to person, place and time: yes Non focal: yes  PSYCH: Pt appears to have normal affect  LABS REVIEWED Last 3 CBC results: No results found for: WBC No results found for: HGB No results found for: HCT  No results found for: PLT  No results found for: CREATININE, BUN, NA, K, CL, CO2  No results found for: HGBA1C  No results found for: HDL No results found for: LDLCALC No results found for: TRIG  No results found for: ALT, AST, GGT, ALKPHOS, BILITOT  No results found for: TSH  Diagnostic Studies Reviewed:  EKG EKG demonstrated normal sinus rhythm, nonspecific ST and T waves changes.  Assessment and Plan   50 y.o. female with  ICD-10-CM ICD-9-CM  1. Atypical chest pain-we will risk stratify with an ETT. Further recommendations after this is complete. R07.89 786.59 CARD stress test only, exercise  2. Hypercholesteremia-continue with Crestor at 5 mg daily and a low-cholesterol diet. LDL goal of less than 130 is recommended. E78.00 272.0  3. Essential hypertension-continue with metoprolol 50 mg daily and losartan  100 mg daily. Weight loss and DASH diet recommended. I10 401.9   Return if symptoms worsen or fail to improve.  These notes generated with voice recognition software. I apologize for typographical errors.  KENNETH ALAN FATH, MD    Electronically Signed by Fath, Kenneth Alan, MD on 07/08/2017 9:30 AM EDT      

## 2017-08-12 ENCOUNTER — Ambulatory Visit
Admission: RE | Admit: 2017-08-12 | Discharge: 2017-08-12 | Disposition: A | Discharge status: Home / Self Care | Payer: Medicare Other | Source: Ambulatory Visit | Attending: Obstetrics & Gynecology | Admitting: Obstetrics & Gynecology

## 2017-08-12 ENCOUNTER — Ambulatory Visit: Payer: Medicare Other | Admitting: Anesthesiology

## 2017-08-12 ENCOUNTER — Encounter
Admission: RE | Disposition: A | Discharge status: Home / Self Care | Payer: Self-pay | Source: Ambulatory Visit | Attending: Obstetrics & Gynecology

## 2017-08-12 ENCOUNTER — Other Ambulatory Visit: Payer: Self-pay

## 2017-08-12 DIAGNOSIS — K219 Gastro-esophageal reflux disease without esophagitis: Secondary | ICD-10-CM | POA: Diagnosis not present

## 2017-08-12 DIAGNOSIS — Z87891 Personal history of nicotine dependence: Secondary | ICD-10-CM | POA: Diagnosis not present

## 2017-08-12 DIAGNOSIS — Z7984 Long term (current) use of oral hypoglycemic drugs: Secondary | ICD-10-CM | POA: Diagnosis not present

## 2017-08-12 DIAGNOSIS — Z96642 Presence of left artificial hip joint: Secondary | ICD-10-CM | POA: Diagnosis not present

## 2017-08-12 DIAGNOSIS — E119 Type 2 diabetes mellitus without complications: Secondary | ICD-10-CM | POA: Insufficient documentation

## 2017-08-12 DIAGNOSIS — R9389 Abnormal findings on diagnostic imaging of other specified body structures: Secondary | ICD-10-CM | POA: Diagnosis present

## 2017-08-12 DIAGNOSIS — E039 Hypothyroidism, unspecified: Secondary | ICD-10-CM | POA: Insufficient documentation

## 2017-08-12 DIAGNOSIS — B20 Human immunodeficiency virus [HIV] disease: Secondary | ICD-10-CM | POA: Insufficient documentation

## 2017-08-12 DIAGNOSIS — Z6841 Body Mass Index (BMI) 40.0 and over, adult: Secondary | ICD-10-CM | POA: Insufficient documentation

## 2017-08-12 DIAGNOSIS — I10 Essential (primary) hypertension: Secondary | ICD-10-CM | POA: Diagnosis not present

## 2017-08-12 DIAGNOSIS — Z79899 Other long term (current) drug therapy: Secondary | ICD-10-CM | POA: Insufficient documentation

## 2017-08-12 DIAGNOSIS — N882 Stricture and stenosis of cervix uteri: Secondary | ICD-10-CM

## 2017-08-12 DIAGNOSIS — R102 Pelvic and perineal pain: Secondary | ICD-10-CM | POA: Diagnosis not present

## 2017-08-12 LAB — URINE DRUG SCREEN, QUALITATIVE (ARMC ONLY)
AMPHETAMINES, UR SCREEN: NOT DETECTED
Barbiturates, Ur Screen: NOT DETECTED
Benzodiazepine, Ur Scrn: NOT DETECTED
COCAINE METABOLITE, UR ~~LOC~~: NOT DETECTED
Cannabinoid 50 Ng, Ur ~~LOC~~: NOT DETECTED
MDMA (Ecstasy)Ur Screen: NOT DETECTED
METHADONE SCREEN, URINE: NOT DETECTED
OPIATE, UR SCREEN: NOT DETECTED
Phencyclidine (PCP) Ur S: NOT DETECTED
Tricyclic, Ur Screen: NOT DETECTED

## 2017-08-12 LAB — GLUCOSE, CAPILLARY
GLUCOSE-CAPILLARY: 107 mg/dL — AB (ref 65–99)
GLUCOSE-CAPILLARY: 106 mg/dL — AB (ref 65–99)

## 2017-08-12 LAB — ABO/RH: ABO/RH(D): O POS

## 2017-08-12 SURGERY — EXAM UNDER ANESTHESIA
Anesthesia: General | Wound class: Clean Contaminated

## 2017-08-12 MED ORDER — LACTATED RINGERS IV SOLN: INTRAVENOUS | Status: DC

## 2017-08-12 MED ORDER — FENTANYL CITRATE (PF) 100 MCG/2ML IJ SOLN
INTRAMUSCULAR | Status: DC | PRN
  Administered 2017-08-12 (×2): 25 ug via INTRAVENOUS

## 2017-08-12 MED ORDER — ACETAMINOPHEN 325 MG PO TABS: 650.0000 mg | ORAL_TABLET | ORAL | Status: DC | PRN

## 2017-08-12 MED ORDER — LIDOCAINE HCL (CARDIAC) 20 MG/ML IV SOLN
INTRAVENOUS | Status: DC | PRN
  Administered 2017-08-12: 60 mg via INTRAVENOUS

## 2017-08-12 MED ORDER — ONDANSETRON HCL 4 MG/2ML IJ SOLN
INTRAMUSCULAR | Status: AC
  Filled 2017-08-12: qty 2

## 2017-08-12 MED ORDER — LIDOCAINE HCL (PF) 2 % IJ SOLN
INTRAMUSCULAR | Status: AC
  Filled 2017-08-12: qty 10

## 2017-08-12 MED ORDER — PROMETHAZINE HCL 25 MG/ML IJ SOLN: 6.2500 mg | INTRAMUSCULAR | Status: DC | PRN

## 2017-08-12 MED ORDER — MIDAZOLAM HCL 2 MG/2ML IJ SOLN
INTRAMUSCULAR | Status: AC
  Filled 2017-08-12: qty 2

## 2017-08-12 MED ORDER — ACETAMINOPHEN 650 MG RE SUPP: 650.0000 mg | RECTAL | Status: DC | PRN

## 2017-08-12 MED ORDER — FENTANYL CITRATE (PF) 100 MCG/2ML IJ SOLN
INTRAMUSCULAR | Status: AC
  Filled 2017-08-12: qty 2

## 2017-08-12 MED ORDER — KETOROLAC TROMETHAMINE 30 MG/ML IJ SOLN: 30.0000 mg | Freq: Four times a day (QID) | INTRAMUSCULAR | Status: DC

## 2017-08-12 MED ORDER — MIDAZOLAM HCL 2 MG/2ML IJ SOLN
INTRAMUSCULAR | Status: DC | PRN
  Administered 2017-08-12: 2 mg via INTRAVENOUS

## 2017-08-12 MED ORDER — DEXAMETHASONE SODIUM PHOSPHATE 10 MG/ML IJ SOLN
INTRAMUSCULAR | Status: DC | PRN
  Administered 2017-08-12: 8 mg via INTRAVENOUS

## 2017-08-12 MED ORDER — DEXAMETHASONE SODIUM PHOSPHATE 10 MG/ML IJ SOLN
INTRAMUSCULAR | Status: AC
  Filled 2017-08-12: qty 1

## 2017-08-12 MED ORDER — PROPOFOL 10 MG/ML IV BOLUS
INTRAVENOUS | Status: DC | PRN
  Administered 2017-08-12: 200 mg via INTRAVENOUS

## 2017-08-12 MED ORDER — MORPHINE SULFATE (PF) 4 MG/ML IV SOLN: 1.0000 mg | INTRAVENOUS | Status: DC | PRN

## 2017-08-12 MED ORDER — PROPOFOL 10 MG/ML IV BOLUS
INTRAVENOUS | Status: AC
  Filled 2017-08-12: qty 20

## 2017-08-12 MED ORDER — SODIUM CHLORIDE 0.9 % IV SOLN
INTRAVENOUS | Status: DC
  Administered 2017-08-12: 08:00:00 via INTRAVENOUS

## 2017-08-12 MED ORDER — OXYCODONE-ACETAMINOPHEN 5-325 MG PO TABS: 1.0000 | ORAL_TABLET | ORAL | 0 refills | Status: DC | PRN

## 2017-08-12 MED ORDER — ONDANSETRON HCL 4 MG/2ML IJ SOLN
INTRAMUSCULAR | Status: DC | PRN
  Administered 2017-08-12: 4 mg via INTRAVENOUS

## 2017-08-12 MED ORDER — FENTANYL CITRATE (PF) 100 MCG/2ML IJ SOLN: 25.0000 ug | INTRAMUSCULAR | Status: DC | PRN

## 2017-08-12 MED ORDER — KETOROLAC TROMETHAMINE 30 MG/ML IJ SOLN
INTRAMUSCULAR | Status: AC
  Filled 2017-08-12: qty 1

## 2017-08-12 MED ORDER — KETOROLAC TROMETHAMINE 30 MG/ML IJ SOLN
INTRAMUSCULAR | Status: DC | PRN
  Administered 2017-08-12: 30 mg via INTRAVENOUS

## 2017-08-12 SURGICAL SUPPLY — 23 items
ABLATOR ENDOMETRIAL MYOSURE (ABLATOR) IMPLANT
BAG COUNTER SPONGE EZ (MISCELLANEOUS) ×2 IMPLANT
CANISTER SUC SOCK COL 7IN (MISCELLANEOUS) ×3 IMPLANT
CATH ROBINSON RED A/P 16FR (CATHETERS) ×3 IMPLANT
COUNTER SPONGE BAG EZ (MISCELLANEOUS) ×1
DEVICE MYOSURE LITE (MISCELLANEOUS) IMPLANT
ELECT REM PT RETURN 9FT ADLT (ELECTROSURGICAL) ×3
ELECTRODE REM PT RTRN 9FT ADLT (ELECTROSURGICAL) ×1 IMPLANT
GLOVE BIO SURGEON STRL SZ8 (GLOVE) ×3 IMPLANT
GOWN STRL REUS W/ TWL LRG LVL3 (GOWN DISPOSABLE) ×1 IMPLANT
GOWN STRL REUS W/ TWL XL LVL3 (GOWN DISPOSABLE) ×1 IMPLANT
GOWN STRL REUS W/TWL LRG LVL3 (GOWN DISPOSABLE) ×2
GOWN STRL REUS W/TWL XL LVL3 (GOWN DISPOSABLE) ×2
PACK DNC HYST (MISCELLANEOUS) ×3 IMPLANT
PAD OB MATERNITY 4.3X12.25 (PERSONAL CARE ITEMS) ×3 IMPLANT
PAD PREP 24X41 OB/GYN DISP (PERSONAL CARE ITEMS) ×3 IMPLANT
SOL .9 NS 3000ML IRR  AL (IV SOLUTION) ×2
SOL .9 NS 3000ML IRR UROMATIC (IV SOLUTION) ×1 IMPLANT
STRAP SAFETY BODY (MISCELLANEOUS) ×3 IMPLANT
TOWEL OR 17X26 4PK STRL BLUE (TOWEL DISPOSABLE) ×3 IMPLANT
TUBING CONNECTING 10 (TUBING) ×2 IMPLANT
TUBING CONNECTING 10' (TUBING) ×1
TUBING HYSTEROSCOPY DOLPHIN (MISCELLANEOUS) ×3 IMPLANT

## 2017-08-12 NOTE — Anesthesia Preprocedure Evaluation (Signed)
Anesthesia Evaluation  Patient identified by MRN, date of birth, ID band Patient awake    Reviewed: Allergy & Precautions, H&P , NPO status , Patient's Chart, lab work & pertinent test results, reviewed documented beta blocker date and time   History of Anesthesia Complications Negative for: history of anesthetic complications  Airway Mallampati: II  TM Distance: >3 FB Neck ROM: full    Dental  (+) Dental Advidsory Given, Poor Dentition, Teeth Intact   Pulmonary neg pulmonary ROS, former smoker,           Cardiovascular Exercise Tolerance: Good hypertension, (-) angina(-) CAD, (-) Past MI, (-) Cardiac Stents and (-) CABG (-) dysrhythmias (-) Valvular Problems/Murmurs     Neuro/Psych negative neurological ROS  negative psych ROS   GI/Hepatic Neg liver ROS, hiatal hernia, GERD  ,  Endo/Other  diabetes, Type 2, Oral Hypoglycemic AgentsHypothyroidism Morbid obesity  Renal/GU negative Renal ROS  negative genitourinary   Musculoskeletal   Abdominal   Peds  Hematology negative hematology ROS (+)   Anesthesia Other Findings Past Medical History: No date: Arthritis     Comment:  FEET No date: Diabetes mellitus without complication (HCC) No date: GERD (gastroesophageal reflux disease) No date: History of hiatal hernia     Comment:  SMALL PER PT No date: HIV (human immunodeficiency virus infection) (HCC) No date: Hypertension No date: Hypothyroidism 2007: Pneumonia   Reproductive/Obstetrics negative OB ROS                             Anesthesia Physical Anesthesia Plan  ASA: III  Anesthesia Plan: General   Post-op Pain Management:    Induction: Intravenous  PONV Risk Score and Plan: 3 and Ondansetron and Dexamethasone  Airway Management Planned: LMA  Additional Equipment:   Intra-op Plan:   Post-operative Plan: Extubation in OR  Informed Consent: I have reviewed the patients  History and Physical, chart, labs and discussed the procedure including the risks, benefits and alternatives for the proposed anesthesia with the patient or authorized representative who has indicated his/her understanding and acceptance.   Dental Advisory Given  Plan Discussed with: Anesthesiologist, CRNA and Surgeon  Anesthesia Plan Comments:         Anesthesia Quick Evaluation

## 2017-08-12 NOTE — Anesthesia Postprocedure Evaluation (Signed)
Anesthesia Post Note  Patient: Misty Larsen  Procedure(s) Performed: EXAM UNDER ANESTHESIA; ATTEMPTED DILATION & CURETTAGE (N/A )  Patient location during evaluation: PACU Anesthesia Type: General Level of consciousness: awake and alert Pain management: pain level controlled Vital Signs Assessment: post-procedure vital signs reviewed and stable Respiratory status: spontaneous breathing, nonlabored ventilation, respiratory function stable and patient connected to nasal cannula oxygen Cardiovascular status: blood pressure returned to baseline and stable Postop Assessment: no apparent nausea or vomiting Anesthetic complications: no     Last Vitals:  Vitals:   08/12/17 1013 08/12/17 1051  BP: (!) 163/97 (!) 144/93  Pulse: 63 70  Resp: 16 16  Temp: (!) 36 C   SpO2: 100% 99%    Last Pain:  Vitals:   08/12/17 1013  TempSrc: Temporal                 Lenard SimmerAndrew Jaxzen Vanhorn

## 2017-08-12 NOTE — H&P (Signed)
History and Physical Interval Note:  08/12/2017 8:21 AM  Misty Larsen  has presented today for surgery, with the diagnosis of ENDOMETRIAL THICKENING, PELVIC PAIN  The various methods of treatment have been discussed with the patient and family. After consideration of risks, benefits and other options for treatment, the patient has consented to  Procedure(s): DILATATION & CURETTAGE/HYSTEROSCOPY WITH MYOSURE (N/A) as a surgical intervention .  The patient's history has been reviewed, patient examined, no change in status, stable for surgery.  Pt has the following beta blocker history-  Chronic >30 days.  I have reviewed the patient's chart and labs.  Questions were answered to the patient's satisfaction.    Letitia Libraobert Paul Menno Vanbergen

## 2017-08-12 NOTE — Transfer of Care (Signed)
Immediate Anesthesia Transfer of Care Note  Patient: Misty Larsen  Procedure(s) Performed: DILATATION & CURETTAGE/HYSTEROSCOPY WITH MYOSURE (N/A )  Patient Location: PACU  Anesthesia Type:General  Level of Consciousness: awake and alert   Airway & Oxygen Therapy: Patient Spontanous Breathing and Patient connected to nasal cannula oxygen  Post-op Assessment: Report given to RN and Post -op Vital signs reviewed and stable  Post vital signs: Reviewed and stable  Last Vitals:  Vitals:   08/12/17 0620 08/12/17 0915  BP: (!) 170/99 (!) 128/92  Pulse: 74 75  Resp: 16 19  Temp: 36.6 C (!) 36.2 C  SpO2: 100% 98%    Last Pain:  Vitals:   08/12/17 0620  TempSrc: Tympanic         Complications: No apparent anesthesia complications

## 2017-08-12 NOTE — Op Note (Signed)
Operative Note  08/12/2017  PRE-OP DIAGNOSIS: Endometrial thickening  POST-OP DIAGNOSIS: same; with cervical stenosis   SURGEON: Annamarie MajorPaul Ruta Capece, MD, FACOG  PROCEDURE: Procedure(s): EXAM UNDER ANESTHESIA ATTEMPTED DILATATION & CURETTAGE/HYSTEROSCOPY    ANESTHESIA: Choice   ESTIMATED BLOOD LOSS: none  SPECIMENS: none  COMPLICATIONS: none  DISPOSITION: PACU - hemodynamically stable.  CONDITION: stable  FINDINGS: Exam under anesthesia revealed small, mobile  uterus with no masses and bilateral adnexa without masses or fullness. Cervix was completely stenotic with inability to pass any dilator or probe.  PROCEDURE IN DETAIL: After informed consent was obtained, the patient was taken to the operating room where anesthesia was obtained without difficulty. The patient was positioned in the dorsal lithotomy position in [place] stirrups. The patient's bladder was catheterized with an in and out foley catheter. The patient was examined under anesthesia, with the above noted findings. The weighted speculum was placed inside the patient's vagina, and the the anterior lip of the cervix was seen and grasped with the tenaculum.  The cervix was visualized with a completely closed/stenotic os; and dilator and probe unable to be passed to any degree. No further attempts were made.  The patient tolerated the procedure well. Sponge, lap and needle counts were correct x2. The patient was taken to recovery room in excellent condition.  Annamarie MajorPaul Shermon Bozzi, MD, Merlinda FrederickFACOG [facility] 08/12/2017  9:07 AM

## 2017-08-12 NOTE — Anesthesia Post-op Follow-up Note (Signed)
Anesthesia QCDR form completed.        

## 2017-08-12 NOTE — Discharge Instructions (Signed)
Monitor for bleeding or pain.  AMBULATORY SURGERY  DISCHARGE INSTRUCTIONS   1) The drugs that you were given will stay in your system until tomorrow so for the next 24 hours you should not:  A) Drive an automobile B) Make any legal decisions C) Drink any alcoholic beverage   2) You may resume regular meals tomorrow.  Today it is better to start with liquids and gradually work up to solid foods.  You may eat anything you prefer, but it is better to start with liquids, then soup and crackers, and gradually work up to solid foods.   3) Please notify your doctor immediately if you have any unusual bleeding, trouble breathing, redness and pain at the surgery site, drainage, fever, or pain not relieved by medication.    4) Additional Instructions:        Please contact your physician with any problems or Same Day Surgery at [phone], Monday through Friday 6 am to 4 pm, or [place] [facility] Main number at [phone].

## 2017-08-13 DIAGNOSIS — B20 Human immunodeficiency virus [HIV] disease: Secondary | ICD-10-CM | POA: Diagnosis not present

## 2017-08-13 DIAGNOSIS — E1165 Type 2 diabetes mellitus with hyperglycemia: Secondary | ICD-10-CM | POA: Diagnosis not present

## 2017-08-13 DIAGNOSIS — I10 Essential (primary) hypertension: Secondary | ICD-10-CM | POA: Diagnosis not present

## 2017-08-27 ENCOUNTER — Ambulatory Visit (INDEPENDENT_AMBULATORY_CARE_PROVIDER_SITE_OTHER): Payer: Medicaid Other | Admitting: Obstetrics & Gynecology

## 2017-08-27 ENCOUNTER — Encounter: Payer: Self-pay | Admitting: Obstetrics & Gynecology

## 2017-08-27 VITALS — BP 120/80 | HR 79 | Ht 67.0 in | Wt 254.0 lb

## 2017-08-27 DIAGNOSIS — N952 Postmenopausal atrophic vaginitis: Secondary | ICD-10-CM | POA: Insufficient documentation

## 2017-08-27 DIAGNOSIS — R9389 Abnormal findings on diagnostic imaging of other specified body structures: Secondary | ICD-10-CM

## 2017-08-27 MED ORDER — REPLENS VA GEL: VAGINAL | 11 refills | Status: DC

## 2017-08-27 NOTE — Patient Instructions (Signed)
REPLENS vaginal therapy

## 2017-08-27 NOTE — Addendum Note (Signed)
Addended by: Nadara MustardHARRIS, Daison Braxton P on: 08/27/2017 03:28 PM   Modules accepted: Orders

## 2017-08-27 NOTE — Progress Notes (Signed)
  Postoperative Follow-up Patient presents post op from EUA, attempted Hyst D&C for endometrial thickening on US, 2 weeks ago.  Subjective: Patient reports continued intermittant lower abd pains; no bleeding (ever); also has continued vag dryness. Eating a regular diet without difficulty. The patient is not having any pain.  Activity: normal activities of daily living. Patient reports vaginal sx's of Vaginal itching  Objective: BP 120/80   Pulse 79   Ht 5\' 7"  (1.702 m)   Wt 254 lb (115.2 kg)   LMP 07/05/2011 (Approximate)   BMI 39.78 kg/m  Physical Exam  Constitutional: She is oriented to person, place, and time. She appears well-developed and well-nourished. No distress.  Musculoskeletal: Normal range of motion.  Neurological: She is alert and oriented to person, place, and time.  Skin: Skin is warm and dry.  Psychiatric: She has a normal mood and affect.  Vitals reviewed.  Assessment: s/p :  EUA, attempted D&C hyst stable  Plan: Patient has done well after surgery with no apparent complications.  I have discussed the post-operative course to date, and the expected progress moving forward.  The patient understands what complications to be concerned about.  I will see the patient in routine follow up, or sooner if needed.    Activity plan: No restriction. Replens for atrophy Options for endometrial thickening discussed.  Hysterectomy vs exp mgt.  Hysterectomy only way to truly biopsy and know cancer risk.  If exp mgt then repeat US regularly and re-discuss options if worsens.  Pain may be from uterus or other etiology.  Pt prefers exp mgt at this time.  Letitia Libraobert Paul Zyaira Vejar 08/27/2017, 3:22 PM

## 2017-09-13 ENCOUNTER — Other Ambulatory Visit: Payer: Self-pay | Admitting: Internal Medicine

## 2017-09-22 ENCOUNTER — Telehealth: Payer: Self-pay | Admitting: Obstetrics & Gynecology

## 2017-09-22 NOTE — Telephone Encounter (Signed)
Patient called to say she is having a lot of pain, especially when she rolls over and when moving from a sitting to standing position, although it starts to feel better after she starts walking. Patient was offered 09/24/17 @ 8am but was unable to accept the appointment due to not having transportation. Patient requested 10/03/17 as she is off work that day and is scheduled to see Dr. Tiburcio PeaHarris on 10/03/17 @ 3:30pm.

## 2017-09-24 NOTE — Telephone Encounter (Signed)
No need for US at this time

## 2017-10-03 ENCOUNTER — Encounter: Payer: Self-pay | Admitting: Obstetrics & Gynecology

## 2017-10-03 ENCOUNTER — Ambulatory Visit (INDEPENDENT_AMBULATORY_CARE_PROVIDER_SITE_OTHER): Payer: Medicare Other | Admitting: Obstetrics & Gynecology

## 2017-10-03 VITALS — Ht 67.0 in | Wt 258.0 lb

## 2017-10-03 DIAGNOSIS — N719 Inflammatory disease of uterus, unspecified: Secondary | ICD-10-CM

## 2017-10-03 DIAGNOSIS — G8929 Other chronic pain: Secondary | ICD-10-CM | POA: Insufficient documentation

## 2017-10-03 DIAGNOSIS — R102 Pelvic and perineal pain: Secondary | ICD-10-CM | POA: Diagnosis not present

## 2017-10-03 LAB — POCT URINALYSIS DIPSTICK
APPEARANCE: NORMAL
BILIRUBIN UA: NEGATIVE
GLUCOSE UA: NEGATIVE
Ketones, UA: NEGATIVE
LEUKOCYTES UA: NEGATIVE
Nitrite, UA: NEGATIVE
Protein, UA: NEGATIVE
RBC UA: NEGATIVE
Spec Grav, UA: 1.02 (ref 1.010–1.025)
Urobilinogen, UA: 1 E.U./dL
pH, UA: 6 (ref 5.0–8.0)

## 2017-10-03 NOTE — Progress Notes (Signed)
Gynecology Pelvic Pain Evaluation   Chief Complaint:  Chief Complaint  Patient presents with  . Pelvic Pain    History of Present Illness:   Patient is a 52 y.o. G0P0000 who LMP was Patient's last menstrual period was 07/05/2011 (approximate)., presents today for a problem visit.  She complains of pain.   Her pain is localized to the suprapubic and deep pelvis area, described as intermittent, began several months ago and its severity is described as moderate. The pain radiates to the  Non-radiating. She has these associated symptoms which include none. Patient has these modifiers which include relaxation that make it better and unable to associate with any factor that make it worse.  Context includes: spontaneous.  Previous studies include [place] at PCP office w endometrial thickening and no mass or cyst.  Unable to perform office EMB or surgical D&C due to cervical stenosis.  PMHx: She  has a past medical history of Arthritis, Diabetes mellitus without complication (HCC), GERD (gastroesophageal reflux disease), History of hiatal hernia, HIV (human immunodeficiency virus infection) (HCC), Hypertension, Hypothyroidism, and Pneumonia (2007). Also,  has a past surgical history that includes Cryotherapy (2013-2014); Colonoscopy (2014); Joint replacement (Left, 2011); Breast surgery; Carpal tunnel release (2012); Cervix surgery (2013); and Biopsy thyroid., family history includes Breast cancer in her mother.,  reports that she quit smoking about 8 months ago. Her smoking use included cigarettes. She started smoking about 41 years ago. She has a 20.50 pack-year smoking history. she has never used smokeless tobacco. She reports that she drinks alcohol. She reports that she does not use drugs.  She has a current medication list which includes the following prescription(s): losartan, abacavir-dolutegravir-lamivudine, levothyroxine, losartan, metformin, metoprolol tartrate, naproxen sodium, omeprazole,  oxycodone-acetaminophen, rosuvastatin, and replens. Also, is allergic to sulfa antibiotics.  Review of Systems  Constitutional: Negative for chills, fever and malaise/fatigue.  HENT: Negative for congestion, sinus pain and sore throat.   Eyes: Negative for blurred vision and pain.  Respiratory: Negative for cough and wheezing.   Cardiovascular: Negative for chest pain and leg swelling.  Gastrointestinal: Negative for abdominal pain, constipation, diarrhea, heartburn, nausea and vomiting.  Genitourinary: Negative for dysuria, frequency, hematuria and urgency.  Musculoskeletal: Negative for back pain, joint pain, myalgias and neck pain.  Skin: Negative for itching and rash.  Neurological: Negative for dizziness, tremors and weakness.  Endo/Heme/Allergies: Does not bruise/bleed easily.  Psychiatric/Behavioral: Negative for depression. The patient is not nervous/anxious and does not have insomnia.    Objective: Ht 5\' 7"  (1.702 m)   Wt 258 lb (117 kg)   LMP 07/05/2011 (Approximate)   BMI 40.41 kg/m  Physical Exam  Constitutional: She is oriented to person, place, and time. She appears well-developed and well-nourished. No distress.  Musculoskeletal: Normal range of motion.  Neurological: She is alert and oriented to person, place, and time.  Skin: Skin is warm and dry.  Psychiatric: She has a normal mood and affect.  Vitals reviewed.  Results for orders placed or performed in visit on 10/03/17  POCT urinalysis dipstick  Result Value Ref Range   Color, UA yellow    Clarity, UA     Glucose, UA neg    Bilirubin, UA neg    Ketones, UA neg    Spec Grav, UA 1.020 1.010 - 1.025   Blood, UA neg    pH, UA 6.0 5.0 - 8.0   Protein, UA neg    Urobilinogen, UA 1.0 0.2 or 1.0 E.U./dL   Nitrite, UA neg  Leukocytes, UA Negative Negative   Appearance normal    Odor     Assessment: 52 y.o. G0P0000 with lower abdominal pains; may be related to uterus but also may have alternative  etiology.  1. Inflammatory or other disease of uterus - CT PELVIS W WO CONTRAST; Future  2. Chronic pelvic pain in female - POCT urinalysis dipstick (neg today) and CT - Consider hysterectomy to remove uterus and its risk for cancer (thickening, obesity, menopausal) as well as possible cause for her pain  Annamarie MajorPaul Timisha Mondry, MD, Merlinda FrederickFACOG [facility], [facility] 10/03/2017  4:45 PM

## 2017-10-07 ENCOUNTER — Other Ambulatory Visit: Payer: Self-pay | Admitting: Obstetrics & Gynecology

## 2017-10-07 DIAGNOSIS — R102 Pelvic and perineal pain: Secondary | ICD-10-CM

## 2017-10-08 ENCOUNTER — Other Ambulatory Visit: Payer: Self-pay | Admitting: Internal Medicine

## 2017-10-10 ENCOUNTER — Other Ambulatory Visit: Payer: Self-pay | Admitting: Obstetrics & Gynecology

## 2017-10-10 ENCOUNTER — Other Ambulatory Visit: Payer: Medicare Other

## 2017-10-10 ENCOUNTER — Ambulatory Visit
Admission: RE | Admit: 2017-10-10 | Discharge: 2017-10-10 | Disposition: A | Discharge status: Home / Self Care | Payer: Medicare Other | Source: Ambulatory Visit | Attending: Obstetrics & Gynecology | Admitting: Obstetrics & Gynecology

## 2017-10-10 ENCOUNTER — Telehealth: Payer: Self-pay

## 2017-10-10 DIAGNOSIS — K5730 Diverticulosis of large intestine without perforation or abscess without bleeding: Secondary | ICD-10-CM | POA: Insufficient documentation

## 2017-10-10 DIAGNOSIS — R102 Pelvic and perineal pain: Secondary | ICD-10-CM | POA: Diagnosis not present

## 2017-10-10 NOTE — Progress Notes (Signed)
Surgery Booking Request Patient Full Name:  Misty Larsen  MRN: [id number]  DOB: [birth year]  Surgeon: Letitia Libraobert Paul Halli Equihua, MD  Requested Surgery Date and Time: 11/18/17 Primary Diagnosis AND Code: Lower abdominal pain, Endometrial thickening Secondary Diagnosis and Code:  Surgical Procedure: TLH/BS L&D Notification: No Admission Status: same day surgery Length of Surgery: 1.5 hr Special Case Needs: none H&P: yes (date) Phone Interview???: yes Interpreter: Language:  Medical Clearance: no Special Scheduling Instructions: no

## 2017-10-10 NOTE — Telephone Encounter (Signed)
[facility] and is refusing contrast as she doesn't want to be stuck c needle.  [phone]  Marlinda MikeDallas had also called front desk who asked PH who said it was okay to do without contrast.

## 2017-10-14 ENCOUNTER — Other Ambulatory Visit: Payer: Self-pay

## 2017-10-14 MED ORDER — ABACAVIR-DOLUTEGRAVIR-LAMIVUD 600-50-300 MG PO TABS: 1.0000 | ORAL_TABLET | ORAL | 1 refills | Status: DC

## 2017-10-14 NOTE — Progress Notes (Signed)
Patient is aware of H&P on 1/236/19 [facility] Dr[facility] Tiburcio PeaHarris, Pre-admit Testing phone interview to be scheduled and OR on 10/30/17 (per patient this is one of the dates Dr. Tiburcio PeaHarris offered). Patient is aware she may receive calls from the Pharmacy and Pre-Service Center.

## 2017-10-15 DIAGNOSIS — E1165 Type 2 diabetes mellitus with hyperglycemia: Secondary | ICD-10-CM | POA: Insufficient documentation

## 2017-10-16 ENCOUNTER — Encounter: Payer: Self-pay | Admitting: Nurse Practitioner

## 2017-10-16 ENCOUNTER — Ambulatory Visit (INDEPENDENT_AMBULATORY_CARE_PROVIDER_SITE_OTHER): Payer: Medicare Other | Admitting: Nurse Practitioner

## 2017-10-16 VITALS — BP 128/70 | HR 79 | Resp 16 | Ht 67.0 in | Wt 259.0 lb

## 2017-10-16 DIAGNOSIS — E1165 Type 2 diabetes mellitus with hyperglycemia: Secondary | ICD-10-CM

## 2017-10-16 DIAGNOSIS — B20 Human immunodeficiency virus [HIV] disease: Secondary | ICD-10-CM | POA: Diagnosis not present

## 2017-10-16 DIAGNOSIS — I10 Essential (primary) hypertension: Secondary | ICD-10-CM | POA: Diagnosis not present

## 2017-10-16 LAB — POCT GLYCOSYLATED HEMOGLOBIN (HGB A1C): Hemoglobin A1C: 6.2

## 2017-10-16 MED ORDER — ABACAVIR-DOLUTEGRAVIR-LAMIVUD 600-50-300 MG PO TABS: 1.0000 | ORAL_TABLET | ORAL | 1 refills | Status: DC

## 2017-10-16 MED ORDER — LANCETS 30G MISC: 4 refills | Status: DC

## 2017-10-16 NOTE — Progress Notes (Signed)
[facility] [address] [place], [place] [address]  Internal MEDICINE  Office Visit Note  Patient Name: Claude Swendsen  [id number]  [id number]  Date of Service: 10/16/2017  No chief complaint on file.   The patient is here for routine follow up exam. She has diabetic. Taking metformin 250mg  twice daily. Sugars in the low 100s when she checks.     Pt is here for routine follow up.    Current Medication: Outpatient Encounter Medications as of 10/16/2017  Medication Sig  . abacavir-dolutegravir-lamiVUDine (TRIUMEQ) 600-50-300 MG tablet Take 1 tablet by mouth every morning.  . ergocalciferol (VITAMIN D2) 50000 units capsule Take 50,000 Units by mouth once a week.  . levothyroxine (SYNTHROID, LEVOTHROID) 50 MCG tablet Take 50 mcg by mouth daily before breakfast.  . losartan-hydrochlorothiazide (HYZAAR) 100-12.5 MG tablet Take 1 tablet by mouth daily.  . metFORMIN (GLUCOPHAGE) 500 MG tablet TAKE 1/2 TABLET BY MOUTH TWICE A DAY  . metoprolol tartrate (LOPRESSOR) 50 MG tablet Take 50 mg by mouth 2 (two) times daily.  Marland Kitchen omeprazole (PRILOSEC) 20 MG capsule Take 20 mg by mouth daily as needed.   . rosuvastatin (CRESTOR) 5 MG tablet Take 5 mg by mouth at bedtime.   Marland Kitchen losartan (COZAAR) 100 MG tablet Take 100 mg every morning by mouth.   . losartan (COZAAR) 100 MG tablet Take by mouth.  . naproxen sodium (ALEVE) 220 MG tablet Take 220 mg by mouth 2 (two) times daily as needed.   Marland Kitchen oxyCODONE-acetaminophen (PERCOCET) 5-325 MG tablet Take 1 tablet every 4 (four) hours as needed by mouth for moderate pain or severe pain.  . Vaginal Lubricant (REPLENS) GEL Apply vaginally nightly for 2 weeks then twice a week   No facility-administered encounter medications on file as of 10/16/2017.     Surgical History: Past Surgical History:  Procedure Laterality Date  . BIOPSY THYROID    . BREAST SURGERY     BIOPSY  . CARPAL TUNNEL RELEASE  2012  . CERVIX SURGERY  2013  . COLONOSCOPY  2014    polyps benign  . CRYOTHERAPY  2013-2014  . JOINT REPLACEMENT Left 2011   HIP    Medical History: Past Medical History:  Diagnosis Date  . Arthritis    FEET  . Diabetes mellitus without complication (HCC)   . GERD (gastroesophageal reflux disease)   . History of hiatal hernia    SMALL PER PT  . HIV (human immunodeficiency virus infection) (HCC)   . Hypertension   . Hypothyroidism   . Pneumonia 2007    Family History: Family History  Problem Relation Age of Onset  . Breast cancer Mother     Social History   Socioeconomic History  . Marital status: Single    Spouse name: Not on file  . Number of children: Not on file  . Years of education: Not on file  . Highest education level: Not on file  Social Needs  . Financial resource strain: Not on file  . Food insecurity - worry: Not on file  . Food insecurity - inability: Not on file  . Transportation needs - medical: Not on file  . Transportation needs - non-medical: Not on file  Occupational History  . Not on file  Tobacco Use  . Smoking status: Former Smoker    Packs/day: 0.50    Years: 41.00    Pack years: 20.50    Types: Cigarettes    Start date: 12/30/1975    Last attempt to  quit: 01/05/2017    Years since quitting: 0.7  . Smokeless tobacco: Never Used  Substance and Sexual Activity  . Alcohol use: Yes    Comment: Occasional  . Drug use: No    Comment: PT STATES SHE HAS BEEN CLEAN FOR 6 YEARS NOW   . Sexual activity: Yes  Other Topics Concern  . Not on file  Social History Narrative  . Not on file      Review of Systems  Constitutional: Negative for chills, fatigue and unexpected weight change.  HENT: Positive for postnasal drip. Negative for congestion, rhinorrhea, sneezing and sore throat.   Eyes: Negative for redness.  Respiratory: Negative for cough, chest tightness and shortness of breath.   Cardiovascular: Negative for chest pain and palpitations.  Gastrointestinal: Negative for abdominal  pain, constipation, diarrhea, nausea and vomiting.  Genitourinary: Negative for dysuria and frequency.  Musculoskeletal: Negative for arthralgias, back pain, joint swelling and neck pain.  Skin: Negative for rash.  Neurological: Negative.  Negative for tremors and numbness.  Hematological: Negative for adenopathy. Does not bruise/bleed easily.  Psychiatric/Behavioral: Negative for behavioral problems (Depression), sleep disturbance and suicidal ideas. The patient is not nervous/anxious.     Today's Vitals   10/16/17 1014  BP: 128/70  Pulse: 79  Resp: 16  SpO2: 94%  Weight: 259 lb (117.5 kg)  Height: 5\' 7"  (1.702 m)    Physical Exam  Constitutional: She is oriented to person, place, and time. She appears well-developed and well-nourished. No distress.  HENT:  Head: Normocephalic and atraumatic.  Mouth/Throat: Oropharynx is clear and moist. No oropharyngeal exudate.  Eyes: EOM are normal. Pupils are equal, round, and reactive to light.  Neck: Normal range of motion. Neck supple. No JVD present. No tracheal deviation present. No thyromegaly present.  Cardiovascular: Normal rate, regular rhythm and normal heart sounds. Exam reveals no gallop and no friction rub.  No murmur heard. Pulmonary/Chest: Effort normal. No respiratory distress. She has no wheezes. She has no rales. She exhibits no tenderness.  Abdominal: Soft. Bowel sounds are normal.  Musculoskeletal: Normal range of motion.  Lymphadenopathy:    She has no cervical adenopathy.  Neurological: She is alert and oriented to person, place, and time. No cranial nerve deficit.  Skin: Skin is warm and dry. She is not diaphoretic.  Psychiatric: She has a normal mood and affect. Her behavior is normal. Judgment and thought content normal.  Nursing note and vitals reviewed.      Assessment/Plan:  1. Uncontrolled type 2 diabetes mellitus with hyperglycemia (HCC) - POCT HgB A1C - 6.2 today. Continue all diabetic medication as  prescribed.  - Lancets 30G MISC; Blood sugar testing done QAM prior to meals - dx E11.65  Dispense: 100 each; Refill: 4  2. HIV (human immunodeficiency virus infection) (HCC) - abacavir-dolutegravir-lamiVUDine (TRIUMEQ) 600-50-300 MG tablet; Take 1 tablet by mouth every morning.  Dispense: 90 tablet; Refill: 1 - Ambulatory referral to Infectious Disease  3. Essential hypertension BP well controlled. Continue bp medications as prescribed.   She should follow up 4 months and sooner if needed   General Counseling: Zanna verbalizes understanding of the findings of todays visit and agrees with plan of treatment. I have discussed any further diagnostic evaluation that may be needed or ordered today. We also reviewed her medications today. she has been encouraged to call the office with any questions or concerns that should arise related to todays visit.    Counseling:    Orders Placed This Encounter  Procedures  . POCT HgB A1C      Time spent: 15 Minutes   Dr Lyndon CodeFozia M Khan Internal medicine

## 2017-10-21 NOTE — Progress Notes (Signed)
PRE-OPERATIVE HISTORY AND PHYSICAL EXAM  HPI:  Misty Larsen is a 52 y.o. G0P0000 Patient's last menstrual period was 07/05/2011 (approximate).; she is being admitted for surgery related to endometrial thickening and pelvic pain.  Her pain is localized to the suprapubic and deep pelvis area, described as intermittent, began several months ago and its severity is described as moderate. The pain radiates to the  Non-radiating. She has these associated symptoms which include none. Patient has these modifiers which include relaxation that make it better and unable to associate with any factor that make it worse.  Context includes: spontaneous.  Previous studies include US at PCP office w endometrial thickening and no mass or cyst.  Unable to perform office EMB or surgical D&C due to cervical stenosis.  PMHx: Past Medical History:  Diagnosis Date  . Arthritis    FEET  . Diabetes mellitus without complication (HCC)   . GERD (gastroesophageal reflux disease)   . History of hiatal hernia    SMALL PER PT  . HIV (human immunodeficiency virus infection) (HCC)   . Hypertension   . Hypothyroidism   . Pneumonia 2007   Past Surgical History:  Procedure Laterality Date  . BIOPSY THYROID    . BREAST SURGERY     BIOPSY  . CARPAL TUNNEL RELEASE  2012  . CERVIX SURGERY  2013  . COLONOSCOPY  2014   polyps benign  . CRYOTHERAPY  2013-2014  . JOINT REPLACEMENT Left 2011   HIP   Family History  Problem Relation Age of Onset  . Breast cancer Mother    Social History   Tobacco Use  . Smoking status: Former Smoker    Packs/day: 0.50    Years: 41.00    Pack years: 20.50    Types: Cigarettes    Start date: 12/30/1975    Last attempt to quit: 01/05/2017    Years since quitting: 0.7  . Smokeless tobacco: Never Used  Substance Use Topics  . Alcohol use: Yes    Comment: Occasional  . Drug use: No    Comment: PT STATES SHE HAS BEEN CLEAN FOR 6 YEARS NOW     Current Outpatient Medications:  .   abacavir-dolutegravir-lamiVUDine (TRIUMEQ) 600-50-300 MG tablet, Take 1 tablet by mouth every morning., Disp: 90 tablet, Rfl: 1 .  ergocalciferol (VITAMIN D2) 50000 units capsule, Take 50,000 Units by mouth once a week., Disp: , Rfl:  .  Lancets 30G MISC, Blood sugar testing done QAM prior to meals - dx E11.65, Disp: 100 each, Rfl: 4 .  levothyroxine (SYNTHROID, LEVOTHROID) 50 MCG tablet, Take 50 mcg by mouth daily before breakfast., Disp: , Rfl:  .  losartan (COZAAR) 100 MG tablet, Take 100 mg every morning by mouth. , Disp: , Rfl:  .  losartan (COZAAR) 100 MG tablet, Take by mouth., Disp: , Rfl:  .  losartan-hydrochlorothiazide (HYZAAR) 100-12.5 MG tablet, Take 1 tablet by mouth daily., Disp: , Rfl:  .  metFORMIN (GLUCOPHAGE) 500 MG tablet, TAKE 1/2 TABLET BY MOUTH TWICE A DAY, Disp: 30 tablet, Rfl: 2 .  metoprolol tartrate (LOPRESSOR) 50 MG tablet, Take 50 mg by mouth 2 (two) times daily., Disp: , Rfl:  .  naproxen sodium (ALEVE) 220 MG tablet, Take 220 mg by mouth 2 (two) times daily as needed. , Disp: , Rfl:  .  omeprazole (PRILOSEC) 20 MG capsule, Take 20 mg by mouth daily as needed. , Disp: , Rfl:  .  oxyCODONE-acetaminophen (PERCOCET) 5-325 MG tablet,  Take 1 tablet every 4 (four) hours as needed by mouth for moderate pain or severe pain., Disp: 15 tablet, Rfl: 0 .  rosuvastatin (CRESTOR) 5 MG tablet, Take 5 mg by mouth at bedtime. , Disp: , Rfl:  .  Vaginal Lubricant (REPLENS) GEL, Apply vaginally nightly for 2 weeks then twice a week, Disp: 35 g, Rfl: 11 Allergies: Sulfa antibiotics  Review of Systems  Constitutional: Negative for chills, fever and malaise/fatigue.  HENT: Negative for congestion, sinus pain and sore throat.   Eyes: Negative for blurred vision and pain.  Respiratory: Negative for cough and wheezing.   Cardiovascular: Negative for chest pain and leg swelling.  Gastrointestinal: Negative for abdominal pain, constipation, diarrhea, heartburn, nausea and vomiting.    Genitourinary: Negative for dysuria, frequency, hematuria and urgency.  Musculoskeletal: Negative for back pain, joint pain, myalgias and neck pain.  Skin: Negative for itching and rash.  Neurological: Negative for dizziness, tremors and weakness.  Endo/Heme/Allergies: Does not bruise/bleed easily.  Psychiatric/Behavioral: Negative for depression. The patient is not nervous/anxious and does not have insomnia.     Objective: LMP 07/05/2011 (Approximate)  There were no vitals filed for this visit. Physical Exam  Constitutional: She is oriented to person, place, and time. She appears well-developed and well-nourished. No distress.  Exam somewhat limited by obesity  Genitourinary: Rectum normal, vagina normal and uterus normal. Pelvic exam was performed with patient supine. There is no rash or lesion on the right labia. There is no rash or lesion on the left labia. Vagina exhibits no lesion. No bleeding in the vagina. Right adnexum does not display mass and does not display tenderness. Left adnexum does not display mass and does not display tenderness. Cervix does not exhibit motion tenderness, lesion, friability or polyp.   Uterus is mobile and midaxial. Uterus is not enlarged or exhibiting a mass.  HENT:  Head: Normocephalic and atraumatic. Head is without laceration.  Right Ear: Hearing normal.  Left Ear: Hearing normal.  Nose: No epistaxis.  No foreign bodies.  Mouth/Throat: Uvula is midline, oropharynx is clear and moist and mucous membranes are normal.  Eyes: Pupils are equal, round, and reactive to light.  Neck: Normal range of motion. Neck supple. No thyromegaly present.  Cardiovascular: Normal rate and regular rhythm. Exam reveals no gallop and no friction rub.  No murmur heard. Pulmonary/Chest: Effort normal and breath sounds normal. No respiratory distress. She has no wheezes. Right breast exhibits no mass, no skin change and no tenderness. Left breast exhibits no mass, no skin  change and no tenderness.  Abdominal: Soft. Bowel sounds are normal. She exhibits no distension. There is no tenderness. There is no rebound.  Musculoskeletal: Normal range of motion.  Neurological: She is alert and oriented to person, place, and time. No cranial nerve deficit.  Skin: Skin is warm and dry.  Psychiatric: She has a normal mood and affect. Judgment normal.  Vitals reviewed.  Assessment: 1. Endometrial thickening on ultrasound   2. Chronic pelvic pain in female   All options discussed, plan hysterectomy.  Ovary preservation discussed.  I have had a careful discussion with this patient about all the options available and the risk/benefits of each. I have fully informed this patient that surgery may subject her to a variety of discomforts and risks: She understands that most patients have surgery with little difficulty, but problems can happen ranging from minor to fatal. These include nausea, vomiting, pain, bleeding, infection, poor healing, hernia, or formation of adhesions. Unexpected reactions  may occur from any drug or anesthetic given. Unintended injury may occur to other pelvic or abdominal structures such as Fallopian tubes, ovaries, bladder, ureter (tube from kidney to bladder), or bowel. Nerves going from the pelvis to the legs may be injured. Any such injury may require immediate or later additional surgery to correct the problem. Excessive blood loss requiring transfusion is very unlikely but possible. Dangerous blood clots may form in the legs or lungs. Physical and sexual activity will be restricted in varying degrees for an indeterminate period of time but most often 2-6 weeks.  Finally, she understands that it is impossible to list every possible undesirable effect and that the condition for which surgery is done is not always cured or significantly improved, and in rare cases may be even worse.Ample time was given to answer all questions.  Annamarie Major, MD, Merlinda Frederick  Ob/Gyn, [facility] 10/21/2017  11:36 AM

## 2017-10-22 ENCOUNTER — Encounter: Payer: Self-pay | Admitting: Obstetrics & Gynecology

## 2017-10-22 ENCOUNTER — Ambulatory Visit (INDEPENDENT_AMBULATORY_CARE_PROVIDER_SITE_OTHER): Payer: Medicare Other | Admitting: Obstetrics & Gynecology

## 2017-10-22 VITALS — BP 120/80 | HR 83 | Ht 67.0 in | Wt 258.0 lb

## 2017-10-22 DIAGNOSIS — R9389 Abnormal findings on diagnostic imaging of other specified body structures: Secondary | ICD-10-CM

## 2017-10-22 DIAGNOSIS — G8929 Other chronic pain: Secondary | ICD-10-CM

## 2017-10-22 DIAGNOSIS — R102 Pelvic and perineal pain: Secondary | ICD-10-CM

## 2017-10-22 NOTE — Patient Instructions (Signed)

## 2017-10-23 ENCOUNTER — Inpatient Hospital Stay: Admission: RE | Admit: 2017-10-23 | Payer: Medicare Other | Source: Ambulatory Visit

## 2017-10-24 ENCOUNTER — Inpatient Hospital Stay: Admission: RE | Admit: 2017-10-24 | Payer: Medicare Other | Source: Ambulatory Visit

## 2017-10-27 ENCOUNTER — Encounter
Admission: RE | Admit: 2017-10-27 | Discharge: 2017-10-27 | Disposition: A | Discharge status: Home / Self Care | Payer: Medicare Other | Source: Ambulatory Visit | Attending: Obstetrics & Gynecology | Admitting: Obstetrics & Gynecology

## 2017-10-27 ENCOUNTER — Telehealth: Payer: Self-pay | Admitting: Obstetrics & Gynecology

## 2017-10-27 NOTE — Telephone Encounter (Signed)
[facility], [phone].

## 2017-10-27 NOTE — Telephone Encounter (Signed)
Patient returned the call, and said she has already returned Heather's call. Patient rescheduled her post op appointment as well, due to having company in town.

## 2017-10-27 NOTE — Patient Instructions (Addendum)
  Your procedure is scheduled on: 10-30-17 THURSDAY Report to Same Day Surgery 2nd floor medical mall [facility](Medical Mall Entrance-take elevator on left to 2nd floor.  Check in with surgery information desk.) To find out your arrival time please call [phone] between 1PM - 3PM on 10-29-17 [facility]  Remember: Instructions that are not followed completely may result in serious medical risk, up to and including death, or upon the discretion of your surgeon and anesthesiologist your surgery may need to be rescheduled.    _x___ 1. Do not eat food after midnight the night before your procedure. NO GUM OR CANDY AFTER MIDNIGHT.  You may drink WATER up to 2 hours before you are scheduled to arrive at the hospital for your procedure.  Do not drink WATER within 2 hours of your scheduled arrival to the hospital.   TYPE 1 AND TYPE 2 DIABETICS CAN ONLY HAVE WATER      __x__ 2. No Alcohol for 24 hours before or after surgery.   __x__3. No Smoking for 24 prior to surgery.   ____  4. Bring all medications with you on the day of surgery if instructed.    __x__ 5. Notify your doctor if there is any change in your medical condition     (cold, fever, infections).     Do not wear jewelry, make-up, hairpins, clips or nail polish.  Do not wear lotions, powders, or perfumes. You may wear deodorant.  Do not shave 48 hours prior to surgery. Men may shave face and neck.  Do not bring valuables to the hospital.    [facility] is not responsible for any belongings or valuables.               Contacts, dentures or bridgework may not be worn into surgery.  Leave your suitcase in the car. After surgery it may be brought to your room.  For patients admitted to the hospital, discharge time is determined by your treatment team.   Patients discharged the day of surgery will not be allowed to drive home.  You will need someone to drive you home and stay with you the night of your procedure.    Please read over the  following fact sheets that you were given:   [facility] Preparing for Surgery and or MRSA Information   _x___ TAKE THE FOLLOWING MEDICATION THE MORNING OF SURGERY WITH A SMALL SIP OF WATER.  These include:  1. TRIUMEQ  2. LEVOTHYROXINE  3. METOPROLOL  4. PRILOSEC  5. TAKE AN EXTRA PRILOSEC ON Wednesday NIGHT BEFORE BED (10-29-17)  6.  ____Fleets enema or Magnesium Citrate as directed.   _x___ Use CHG Soap or sage wipes as directed on instruction sheet   ____ Use inhalers on the day of surgery and bring to hospital day of surgery  _X___ Stop Metformin 2 days prior to surgery-LAST DOSE ON Monday, January 28TH    ____ Take 1/2 of usual insulin dose the night before surgery and none on the morning surgery.   ____ Follow recommendations from Cardiologist, Pulmonologist or PCP regarding stopping Aspirin, Coumadin, Plavix ,Eliquis, Effient, or Pradaxa, and Pletal.  X____Stop Anti-inflammatories such as Advil, Aleve, Ibuprofen, Motrin, Naproxen, Naprosyn, Goodies powders or aspirin products NOW- OK to take Tylenol    ____ Stop supplements until after surgery.     ____ Bring C-Pap to the hospital.

## 2017-10-29 ENCOUNTER — Encounter
Admission: RE | Admit: 2017-10-29 | Discharge: 2017-10-29 | Disposition: A | Discharge status: Home / Self Care | Payer: Medicare Other | Source: Ambulatory Visit | Attending: Obstetrics & Gynecology | Admitting: Obstetrics & Gynecology

## 2017-10-29 DIAGNOSIS — Z79899 Other long term (current) drug therapy: Secondary | ICD-10-CM | POA: Diagnosis not present

## 2017-10-29 DIAGNOSIS — N840 Polyp of corpus uteri: Secondary | ICD-10-CM | POA: Diagnosis not present

## 2017-10-29 DIAGNOSIS — E119 Type 2 diabetes mellitus without complications: Secondary | ICD-10-CM | POA: Diagnosis not present

## 2017-10-29 DIAGNOSIS — E039 Hypothyroidism, unspecified: Secondary | ICD-10-CM | POA: Diagnosis not present

## 2017-10-29 DIAGNOSIS — K219 Gastro-esophageal reflux disease without esophagitis: Secondary | ICD-10-CM | POA: Diagnosis not present

## 2017-10-29 DIAGNOSIS — B20 Human immunodeficiency virus [HIV] disease: Secondary | ICD-10-CM | POA: Diagnosis not present

## 2017-10-29 DIAGNOSIS — K449 Diaphragmatic hernia without obstruction or gangrene: Secondary | ICD-10-CM | POA: Diagnosis not present

## 2017-10-29 DIAGNOSIS — N888 Other specified noninflammatory disorders of cervix uteri: Secondary | ICD-10-CM | POA: Diagnosis not present

## 2017-10-29 DIAGNOSIS — Z9889 Other specified postprocedural states: Secondary | ICD-10-CM | POA: Diagnosis not present

## 2017-10-29 DIAGNOSIS — Z87891 Personal history of nicotine dependence: Secondary | ICD-10-CM | POA: Diagnosis not present

## 2017-10-29 DIAGNOSIS — Z79890 Hormone replacement therapy (postmenopausal): Secondary | ICD-10-CM | POA: Diagnosis not present

## 2017-10-29 DIAGNOSIS — I10 Essential (primary) hypertension: Secondary | ICD-10-CM | POA: Diagnosis not present

## 2017-10-29 DIAGNOSIS — R1030 Lower abdominal pain, unspecified: Secondary | ICD-10-CM | POA: Diagnosis not present

## 2017-10-29 DIAGNOSIS — R9389 Abnormal findings on diagnostic imaging of other specified body structures: Secondary | ICD-10-CM | POA: Diagnosis not present

## 2017-10-29 DIAGNOSIS — Z86010 Personal history of colonic polyps: Secondary | ICD-10-CM | POA: Diagnosis not present

## 2017-10-29 DIAGNOSIS — Z7984 Long term (current) use of oral hypoglycemic drugs: Secondary | ICD-10-CM | POA: Diagnosis not present

## 2017-10-29 DIAGNOSIS — M19072 Primary osteoarthritis, left ankle and foot: Secondary | ICD-10-CM | POA: Diagnosis not present

## 2017-10-29 DIAGNOSIS — M19071 Primary osteoarthritis, right ankle and foot: Secondary | ICD-10-CM | POA: Diagnosis not present

## 2017-10-29 DIAGNOSIS — Z96642 Presence of left artificial hip joint: Secondary | ICD-10-CM | POA: Diagnosis not present

## 2017-10-29 DIAGNOSIS — Z882 Allergy status to sulfonamides status: Secondary | ICD-10-CM | POA: Diagnosis not present

## 2017-10-29 DIAGNOSIS — R102 Pelvic and perineal pain: Secondary | ICD-10-CM | POA: Diagnosis not present

## 2017-10-29 DIAGNOSIS — K660 Peritoneal adhesions (postprocedural) (postinfection): Secondary | ICD-10-CM | POA: Diagnosis not present

## 2017-10-29 DIAGNOSIS — Z6841 Body Mass Index (BMI) 40.0 and over, adult: Secondary | ICD-10-CM | POA: Diagnosis not present

## 2017-10-29 LAB — BASIC METABOLIC PANEL
Anion gap: 12 (ref 5–15)
BUN: 19 mg/dL (ref 6–20)
CALCIUM: 9.2 mg/dL (ref 8.9–10.3)
CHLORIDE: 107 mmol/L (ref 101–111)
CO2: 23 mmol/L (ref 22–32)
Creatinine, Ser: 0.88 mg/dL (ref 0.44–1.00)
GFR calc non Af Amer: >60 mL/min (ref >60)
GLUCOSE: 91 mg/dL (ref 65–99)
POTASSIUM: 3.8 mmol/L (ref 3.5–5.1)
Sodium: 142 mmol/L (ref 135–145)

## 2017-10-29 LAB — CBC
HEMATOCRIT: 44.2 % (ref 35.0–47.0)
HEMOGLOBIN: 14.6 g/dL (ref 12.0–16.0)
MCH: 28.4 pg (ref 26.0–34.0)
MCHC: 33 g/dL (ref 32.0–36.0)
MCV: 86.1 fL (ref 80.0–100.0)
Platelets: 229 10*3/uL (ref 150–440)
RBC: 5.13 MIL/uL (ref 3.80–5.20)
RDW: 14.8 % — ABNORMAL HIGH (ref 11.5–14.5)
WBC: 7.9 10*3/uL (ref 3.6–11.0)

## 2017-10-29 LAB — PROTIME-INR
INR: 0.92
PROTHROMBIN TIME: 12.3 s (ref 11.4–15.2)

## 2017-10-29 LAB — TYPE AND SCREEN
ABO/RH(D): O POS
ANTIBODY SCREEN: NEGATIVE

## 2017-10-29 LAB — APTT: aPTT: 30 seconds (ref 24–36)

## 2017-10-29 MED ORDER — CEFOXITIN SODIUM-DEXTROSE 2-2.2 GM-%(50ML) IV SOLR
2.0000 g | INTRAVENOUS | Status: AC
  Administered 2017-10-30: 2 g via INTRAVENOUS

## 2017-10-29 NOTE — Pre-Procedure Instructions (Signed)
Progress Notes - in this encounter  Denton Ar, MD - 07/08/2017 9:30 AM EDT Formatting of this note might be different from the original.   Chief Complaint: Chief Complaint  Patient presents with  . Pre-op Exam  cardiac clearance  . Establish Care  Date of Service: 07/08/2017 Date of Birth: July 31, 1966 PCP: Lyndon Code, MD  History of Present Illness: Misty Larsen is a 52 y.o.female patient who presents for evaluation and preoperative risk assessment. She has a history of hyperlipidemia hypertension. Also has some dyspnea on exertion. Her baseline electrocardiogram shows sinus rhythm with nonspecific ST-T wave changes. She was evaluated with a chest x-ray in September in the emergency room which revealed no edema. She has endometrial thickening and is being evaluated for hysteroscopy and probable D&C.  Past Medical and Surgical History  Past Medical History Past Medical History:  Diagnosis Date  . GERD (gastroesophageal reflux disease)  . HIV (human immunodeficiency virus infection) (CMS-HCC)  . Hyperlipidemia, unspecified  . Hypertension  . Hypothyroidism (acquired), unspecified  . Obesity, unspecified   Past Surgical History She has no past surgical history on file.   Medications and Allergies  Current Medications  Current Outpatient Prescriptions  Medication Sig Dispense Refill  . abacavir-dolutegravir-lamiVUDine (TRIUMEQ) 600-50-300 mg tablet Take 1 tablet by mouth once daily.  Marland Kitchen levothyroxine (SYNTHROID, LEVOTHROID) 50 MCG tablet Take 50 mcg by mouth once daily. Take on an empty stomach with a glass of water at least 30-60 minutes before breakfast.  . metFORMIN (GLUCOPHAGE) 500 MG tablet Take 1 tablet by mouth 2 (two) times daily.  . metoprolol tartrate (LOPRESSOR) 50 MG tablet Take 50 mg by mouth 2 (two) times daily.  Marland Kitchen omeprazole (PRILOSEC) 40 MG DR capsule Take 40 mg by mouth 2 (two) times daily.  . rosuvastatin (CRESTOR) 5 MG tablet Take 5 mg by mouth once  daily.  Marland Kitchen losartan (COZAAR) 100 MG tablet Take 1 tablet (100 mg total) by mouth once daily. 30 tablet 11   No current facility-administered medications for this visit.   Allergies: Sulfa (sulfonamide antibiotics)  Social and Family History  Social History reports that she quit smoking about 6 months ago. She has never used smokeless tobacco. She reports that she drinks alcohol. She reports that she does not use drugs.  Family History History reviewed. No pertinent family history.  Review of Systems  Review of Systems  Constitutional: Negative for chills, diaphoresis, fever, malaise/fatigue and weight loss.  HENT: Negative for congestion, ear discharge, hearing loss and tinnitus.  Eyes: Negative for blurred vision.  Respiratory: Negative for cough, hemoptysis, sputum production, shortness of breath and wheezing.  Cardiovascular: Negative for chest pain, palpitations, orthopnea, claudication, leg swelling and PND.  Gastrointestinal: Negative for abdominal pain, blood in stool, constipation, diarrhea, heartburn, melena, nausea and vomiting.  Genitourinary: Negative for dysuria, frequency, hematuria and urgency.  Musculoskeletal: Negative for back pain, falls, joint pain and myalgias.  Skin: Negative for itching and rash.  Neurological: Negative for dizziness, tingling, focal weakness, loss of consciousness, weakness and headaches.  Endo/Heme/Allergies: Negative for polydipsia. Does not bruise/bleed easily.  Psychiatric/Behavioral: Negative for depression, memory loss and substance abuse. The patient is not nervous/anxious.   Physical Examination   Vitals:BP (!) 150/98  Pulse 80  Resp 12  Ht 170.2 cm (5\' 7" )  Wt (!) 117.5 kg (259 lb)  BMI 40.57 kg/m  Ht:170.2 cm (5\' 7" ) Wt:(!) 117.5 kg (259 lb) BJY:NWGN surface area is 2.36 meters squared. Body mass index is 40.57 kg/m.  Wt Readings from Last 3 Encounters:  07/08/17 (!) 117.5 kg (259 lb)   BP Readings from Last 3 Encounters:    07/08/17 (!) 150/98   General appearance appears in no acute distress  Head Mouth and Eye exam Normocephalic, without obvious abnormality, atraumatic Dentition is good Eyes appear anicteric   Neck exam Thyroid: normal  Nodes: no obvious adenopathy  LUNGS Breath Sounds: Normal Percussion: Normal  CARDIOVASCULAR JVP CV wave: no HJR: no Elevation at 90 degrees: None Carotid Pulse: normal pulsation bilaterally Bruit: None Apex: apical impulse normal  Auscultation Rhythm: normal sinus rhythm S1: normal S2: normal Clicks: no Rub: no Murmurs: no murmurs  Gallop: None ABDOMEN Liver enlargement: no Pulsatile aorta: no Ascites: no Bruits: no  EXTREMITIES Clubbing: no Edema: trace to 1+ bilateral pedal edema Pulses: peripheral pulses symmetrical Femoral Bruits: no Amputation: no SKIN Rash: no Cyanosis: no Embolic phemonenon: no Bruising: no NEURO Alert and Oriented to person, place and time: yes Non focal: yes  PSYCH: Pt appears to have normal affect  LABS REVIEWED Last 3 CBC results: No results found for: WBC No results found for: HGB No results found for: HCT  No results found for: PLT  No results found for: CREATININE, BUN, NA, K, CL, CO2  No results found for: HGBA1C  No results found for: HDL No results found for: LDLCALC No results found for: TRIG  No results found for: ALT, AST, GGT, ALKPHOS, BILITOT  No results found for: TSH  Diagnostic Studies Reviewed:  EKG EKG demonstrated normal sinus rhythm, nonspecific ST and T waves changes.  Assessment and Plan   52 y.o. female with  ICD-10-CM ICD-9-CM  1. Atypical chest pain-we will risk stratify with an ETT. Further recommendations after this is complete. R07.89 786.59 CARD stress test only, exercise  2. Hypercholesteremia-continue with Crestor at 5 mg daily and a low-cholesterol diet. LDL goal of less than 130 is recommended. E78.00 272.0  3. Essential hypertension-continue with  metoprolol 50 mg daily and losartan 100 mg daily. Weight loss and DASH diet recommended. I10 401.9   Return if symptoms worsen or fail to improve.  These notes generated with voice recognition software. I apologize for typographical errors.  Denton ArKENNETH ALAN FATH, MD    Electronically Signed by Denton ArFath, Kenneth Alan, MD on 07/08/2017 9:30 AM EDT     Plan of Treatment - as of this encounter  Not on file    Miscellaneous Results - in this encounter   CARD stress test only, exercise (07/08/2017 10:53 AM EDT) CARD stress test only, exercise (07/08/2017 10:53 AM EDT)  Narrative Performed At  This result has an attachment that is not available.         Visit Diagnoses - in this encounter  Diagnosis  Atypical chest pain - Primary  Other chest pain   Hypercholesteremia  Pure hypercholesterolemia   Essential hypertension    Historical Medications - added in this encounter  This list may reflect changes made after this encounter.  Medication Sig Dispensed Refills Start Date End Date  metFORMIN (GLUCOPHAGE) 500 MG tablet  Take 1 tablet by mouth 2 (two) times daily.  0    abacavir-dolutegravir-lamiVUDine (TRIUMEQ) 600-50-300 mg tablet  Take 1 tablet by mouth once daily.  0    omeprazole (PRILOSEC) 40 MG DR capsule  Take 40 mg by mouth 2 (two) times daily.  0    metoprolol tartrate (LOPRESSOR) 50 MG tablet  Take 50 mg by mouth 2 (two) times daily.  0  levothyroxine (SYNTHROID, LEVOTHROID) 50 MCG tablet  Take 50 mcg by mouth once daily. Take on an empty stomach with a glass of water at least 30-60 minutes before breakfast.  0    rosuvastatin (CRESTOR) 5 MG tablet  Take 5 mg by mouth once daily.  0     Images Document Information  Primary Care Provider Other Service Providers Document Coverage Dates  Lyndon Code MD (Oct. 09, 2018 - Present) [phone] (Work) [phone] (Fax) [address] [place], [place] [address]   Oct. 09, 2018    Custodian Organization  [facility] [address] [place], [place] [address]   Encounter Providers Encounter Date  Denton Ar MD (Attending) [phone] (Work) [phone] [facility] [address] [facility] [place], [place] [address]  Oct. 09, 2018

## 2017-10-30 ENCOUNTER — Encounter
Admission: RE | Disposition: A | Discharge status: Home / Self Care | Payer: Self-pay | Source: Ambulatory Visit | Attending: Obstetrics & Gynecology

## 2017-10-30 ENCOUNTER — Observation Stay
Admission: RE | Admit: 2017-10-30 | Discharge: 2017-10-31 | Disposition: A | Discharge status: Home / Self Care | Payer: Medicare Other | Source: Ambulatory Visit | Attending: Obstetrics & Gynecology | Admitting: Obstetrics & Gynecology

## 2017-10-30 ENCOUNTER — Encounter: Payer: Self-pay | Admitting: *Deleted

## 2017-10-30 ENCOUNTER — Other Ambulatory Visit: Payer: Self-pay

## 2017-10-30 ENCOUNTER — Ambulatory Visit: Payer: Medicare Other | Admitting: Registered Nurse

## 2017-10-30 DIAGNOSIS — K219 Gastro-esophageal reflux disease without esophagitis: Secondary | ICD-10-CM | POA: Insufficient documentation

## 2017-10-30 DIAGNOSIS — K660 Peritoneal adhesions (postprocedural) (postinfection): Secondary | ICD-10-CM | POA: Diagnosis not present

## 2017-10-30 DIAGNOSIS — M19072 Primary osteoarthritis, left ankle and foot: Secondary | ICD-10-CM | POA: Diagnosis not present

## 2017-10-30 DIAGNOSIS — Z79899 Other long term (current) drug therapy: Secondary | ICD-10-CM | POA: Diagnosis not present

## 2017-10-30 DIAGNOSIS — R1030 Lower abdominal pain, unspecified: Secondary | ICD-10-CM | POA: Insufficient documentation

## 2017-10-30 DIAGNOSIS — K449 Diaphragmatic hernia without obstruction or gangrene: Secondary | ICD-10-CM | POA: Insufficient documentation

## 2017-10-30 DIAGNOSIS — Z7984 Long term (current) use of oral hypoglycemic drugs: Secondary | ICD-10-CM | POA: Insufficient documentation

## 2017-10-30 DIAGNOSIS — I10 Essential (primary) hypertension: Secondary | ICD-10-CM | POA: Insufficient documentation

## 2017-10-30 DIAGNOSIS — Z79890 Hormone replacement therapy (postmenopausal): Secondary | ICD-10-CM | POA: Insufficient documentation

## 2017-10-30 DIAGNOSIS — Z86010 Personal history of colonic polyps: Secondary | ICD-10-CM | POA: Insufficient documentation

## 2017-10-30 DIAGNOSIS — E119 Type 2 diabetes mellitus without complications: Secondary | ICD-10-CM | POA: Insufficient documentation

## 2017-10-30 DIAGNOSIS — Z87891 Personal history of nicotine dependence: Secondary | ICD-10-CM | POA: Diagnosis not present

## 2017-10-30 DIAGNOSIS — E039 Hypothyroidism, unspecified: Secondary | ICD-10-CM | POA: Diagnosis not present

## 2017-10-30 DIAGNOSIS — N888 Other specified noninflammatory disorders of cervix uteri: Principal | ICD-10-CM | POA: Insufficient documentation

## 2017-10-30 DIAGNOSIS — E1165 Type 2 diabetes mellitus with hyperglycemia: Secondary | ICD-10-CM | POA: Diagnosis not present

## 2017-10-30 DIAGNOSIS — B20 Human immunodeficiency virus [HIV] disease: Secondary | ICD-10-CM | POA: Insufficient documentation

## 2017-10-30 DIAGNOSIS — Z6841 Body Mass Index (BMI) 40.0 and over, adult: Secondary | ICD-10-CM | POA: Insufficient documentation

## 2017-10-30 DIAGNOSIS — Z96642 Presence of left artificial hip joint: Secondary | ICD-10-CM | POA: Diagnosis not present

## 2017-10-30 DIAGNOSIS — N840 Polyp of corpus uteri: Secondary | ICD-10-CM | POA: Insufficient documentation

## 2017-10-30 DIAGNOSIS — G8929 Other chronic pain: Secondary | ICD-10-CM | POA: Diagnosis present

## 2017-10-30 DIAGNOSIS — R102 Pelvic and perineal pain: Secondary | ICD-10-CM | POA: Insufficient documentation

## 2017-10-30 DIAGNOSIS — Z803 Family history of malignant neoplasm of breast: Secondary | ICD-10-CM | POA: Insufficient documentation

## 2017-10-30 DIAGNOSIS — M19071 Primary osteoarthritis, right ankle and foot: Secondary | ICD-10-CM | POA: Diagnosis not present

## 2017-10-30 DIAGNOSIS — R9389 Abnormal findings on diagnostic imaging of other specified body structures: Secondary | ICD-10-CM | POA: Insufficient documentation

## 2017-10-30 DIAGNOSIS — Z9889 Other specified postprocedural states: Secondary | ICD-10-CM | POA: Insufficient documentation

## 2017-10-30 DIAGNOSIS — Z882 Allergy status to sulfonamides status: Secondary | ICD-10-CM | POA: Diagnosis not present

## 2017-10-30 HISTORY — PX: LAPAROSCOPIC HYSTERECTOMY: SHX1926

## 2017-10-30 HISTORY — PX: CYSTOSCOPY: SHX5120

## 2017-10-30 LAB — URINE DRUG SCREEN, QUALITATIVE (ARMC ONLY)
Amphetamines, Ur Screen: NOT DETECTED
BARBITURATES, UR SCREEN: NOT DETECTED
Benzodiazepine, Ur Scrn: NOT DETECTED
CANNABINOID 50 NG, UR ~~LOC~~: NOT DETECTED
COCAINE METABOLITE, UR ~~LOC~~: NOT DETECTED
MDMA (Ecstasy)Ur Screen: NOT DETECTED
METHADONE SCREEN, URINE: NOT DETECTED
OPIATE, UR SCREEN: NOT DETECTED
Phencyclidine (PCP) Ur S: NOT DETECTED
TRICYCLIC, UR SCREEN: NOT DETECTED

## 2017-10-30 LAB — GLUCOSE, CAPILLARY
GLUCOSE-CAPILLARY: 109 mg/dL — AB (ref 65–99)
GLUCOSE-CAPILLARY: 143 mg/dL — AB (ref 65–99)

## 2017-10-30 SURGERY — HYSTERECTOMY, TOTAL, LAPAROSCOPIC
Anesthesia: General | Laterality: Bilateral

## 2017-10-30 MED ORDER — ACETAMINOPHEN 325 MG PO TABS
650.0000 mg | ORAL_TABLET | ORAL | Status: DC | PRN
  Administered 2017-10-30 – 2017-10-31 (×2): 650 mg via ORAL
  Filled 2017-10-30 (×2): qty 2

## 2017-10-30 MED ORDER — HYDROCHLOROTHIAZIDE 12.5 MG PO CAPS
12.5000 mg | ORAL_CAPSULE | Freq: Every day | ORAL | Status: DC
  Administered 2017-10-30 – 2017-10-31 (×2): 12.5 mg via ORAL
  Filled 2017-10-30 (×2): qty 1

## 2017-10-30 MED ORDER — METOPROLOL TARTRATE 50 MG PO TABS
50.0000 mg | ORAL_TABLET | Freq: Two times a day (BID) | ORAL | Status: DC
  Administered 2017-10-30 – 2017-10-31 (×3): 50 mg via ORAL
  Filled 2017-10-30 (×4): qty 1

## 2017-10-30 MED ORDER — SODIUM CHLORIDE 0.9 % IR SOLN
Status: DC | PRN
  Administered 2017-10-30: 1600 mL

## 2017-10-30 MED ORDER — BUPIVACAINE HCL (PF) 0.5 % IJ SOLN
INTRAMUSCULAR | Status: AC
  Filled 2017-10-30: qty 30

## 2017-10-30 MED ORDER — ONDANSETRON HCL 4 MG/2ML IJ SOLN: 4.0000 mg | Freq: Four times a day (QID) | INTRAMUSCULAR | Status: DC | PRN

## 2017-10-30 MED ORDER — PROPOFOL 10 MG/ML IV BOLUS
INTRAVENOUS | Status: DC | PRN
  Administered 2017-10-30: 120 mg via INTRAVENOUS

## 2017-10-30 MED ORDER — OXYCODONE-ACETAMINOPHEN 5-325 MG PO TABS: 1.0000 | ORAL_TABLET | ORAL | 0 refills | Status: DC | PRN

## 2017-10-30 MED ORDER — FLUORESCEIN SODIUM 10 % IV SOLN
INTRAVENOUS | Status: AC
  Filled 2017-10-30: qty 5

## 2017-10-30 MED ORDER — ONDANSETRON HCL 4 MG PO TABS
4.0000 mg | ORAL_TABLET | Freq: Four times a day (QID) | ORAL | Status: DC | PRN
Start: 2017-10-30 — End: 2017-10-31

## 2017-10-30 MED ORDER — LACTATED RINGERS IV SOLN: INTRAVENOUS | Status: DC

## 2017-10-30 MED ORDER — SUGAMMADEX SODIUM 500 MG/5ML IV SOLN
INTRAVENOUS | Status: DC | PRN
  Administered 2017-10-30: 500 mg via INTRAVENOUS

## 2017-10-30 MED ORDER — CEFOXITIN SODIUM-DEXTROSE 2-2.2 GM-%(50ML) IV SOLR
INTRAVENOUS | Status: AC
  Filled 2017-10-30: qty 50

## 2017-10-30 MED ORDER — MIDAZOLAM HCL 2 MG/2ML IJ SOLN
INTRAMUSCULAR | Status: AC
  Filled 2017-10-30: qty 2

## 2017-10-30 MED ORDER — OXYCODONE-ACETAMINOPHEN 5-325 MG PO TABS: 1.0000 | ORAL_TABLET | ORAL | Status: DC | PRN

## 2017-10-30 MED ORDER — ACETAMINOPHEN 650 MG RE SUPP: 650.0000 mg | RECTAL | Status: DC | PRN

## 2017-10-30 MED ORDER — ARTIFICIAL TEARS OPHTHALMIC OINT
TOPICAL_OINTMENT | OPHTHALMIC | Status: AC
  Filled 2017-10-30: qty 3.5

## 2017-10-30 MED ORDER — METFORMIN HCL 500 MG PO TABS
250.0000 mg | ORAL_TABLET | Freq: Two times a day (BID) | ORAL | Status: DC
  Administered 2017-10-31: 250 mg via ORAL
  Filled 2017-10-30 (×2): qty 1

## 2017-10-30 MED ORDER — MORPHINE SULFATE (PF) 2 MG/ML IV SOLN: 1.0000 mg | INTRAVENOUS | Status: DC | PRN

## 2017-10-30 MED ORDER — ONDANSETRON HCL 4 MG/2ML IJ SOLN: 4.0000 mg | Freq: Once | INTRAMUSCULAR | Status: DC | PRN

## 2017-10-30 MED ORDER — ABACAVIR-DOLUTEGRAVIR-LAMIVUD 600-50-300 MG PO TABS
1.0000 | ORAL_TABLET | ORAL | Status: DC
  Administered 2017-10-31: 1 via ORAL
  Filled 2017-10-30: qty 1

## 2017-10-30 MED ORDER — ROCURONIUM BROMIDE 50 MG/5ML IV SOLN
INTRAVENOUS | Status: AC
  Filled 2017-10-30: qty 1

## 2017-10-30 MED ORDER — HYDROMORPHONE HCL 1 MG/ML IJ SOLN
INTRAMUSCULAR | Status: AC
  Filled 2017-10-30: qty 1

## 2017-10-30 MED ORDER — KETOROLAC TROMETHAMINE 30 MG/ML IJ SOLN
30.0000 mg | Freq: Four times a day (QID) | INTRAMUSCULAR | Status: DC
Start: 2017-10-30 — End: 2017-10-30

## 2017-10-30 MED ORDER — SUGAMMADEX SODIUM 200 MG/2ML IV SOLN
INTRAVENOUS | Status: DC | PRN
  Administered 2017-10-30: 10 mg via INTRAVENOUS

## 2017-10-30 MED ORDER — ROCURONIUM BROMIDE 100 MG/10ML IV SOLN
INTRAVENOUS | Status: DC | PRN
  Administered 2017-10-30 (×4): 10 mg via INTRAVENOUS
  Administered 2017-10-30: 40 mg via INTRAVENOUS
  Administered 2017-10-30: 10 mg via INTRAVENOUS

## 2017-10-30 MED ORDER — FENTANYL CITRATE (PF) 100 MCG/2ML IJ SOLN
25.0000 ug | INTRAMUSCULAR | Status: DC | PRN
  Administered 2017-10-30 (×4): 25 ug via INTRAVENOUS

## 2017-10-30 MED ORDER — DOCUSATE SODIUM 100 MG PO CAPS
100.0000 mg | ORAL_CAPSULE | Freq: Two times a day (BID) | ORAL | Status: DC
  Administered 2017-10-30 – 2017-10-31 (×2): 100 mg via ORAL
  Filled 2017-10-30 (×2): qty 1

## 2017-10-30 MED ORDER — FENTANYL CITRATE (PF) 250 MCG/5ML IJ SOLN
INTRAMUSCULAR | Status: AC
  Filled 2017-10-30: qty 5

## 2017-10-30 MED ORDER — LOSARTAN POTASSIUM 50 MG PO TABS
100.0000 mg | ORAL_TABLET | Freq: Every day | ORAL | Status: DC
  Administered 2017-10-30 – 2017-10-31 (×2): 100 mg via ORAL
  Filled 2017-10-30 (×2): qty 2

## 2017-10-30 MED ORDER — SUCCINYLCHOLINE CHLORIDE 20 MG/ML IJ SOLN
INTRAMUSCULAR | Status: DC | PRN
  Administered 2017-10-30: 120 mg via INTRAVENOUS

## 2017-10-30 MED ORDER — KETOROLAC TROMETHAMINE 30 MG/ML IJ SOLN
INTRAMUSCULAR | Status: DC | PRN
  Administered 2017-10-30: 30 mg via INTRAVENOUS

## 2017-10-30 MED ORDER — ONDANSETRON HCL 4 MG/2ML IJ SOLN
INTRAMUSCULAR | Status: AC
  Filled 2017-10-30: qty 2

## 2017-10-30 MED ORDER — LOSARTAN POTASSIUM-HCTZ 100-12.5 MG PO TABS: 1.0000 | ORAL_TABLET | Freq: Every day | ORAL | Status: DC

## 2017-10-30 MED ORDER — SIMETHICONE 80 MG PO CHEW: 80.0000 mg | CHEWABLE_TABLET | Freq: Four times a day (QID) | ORAL | Status: DC | PRN

## 2017-10-30 MED ORDER — FENTANYL CITRATE (PF) 100 MCG/2ML IJ SOLN
INTRAMUSCULAR | Status: DC | PRN
  Administered 2017-10-30 (×3): 50 ug via INTRAVENOUS
  Administered 2017-10-30: 100 ug via INTRAVENOUS
  Administered 2017-10-30: 50 ug via INTRAVENOUS

## 2017-10-30 MED ORDER — SODIUM CHLORIDE 0.9 % IV SOLN
INTRAVENOUS | Status: DC
  Administered 2017-10-30 (×2): via INTRAVENOUS

## 2017-10-30 MED ORDER — LEVOTHYROXINE SODIUM 50 MCG PO TABS
50.0000 ug | ORAL_TABLET | Freq: Every day | ORAL | Status: DC
  Administered 2017-10-31: 50 ug via ORAL
  Filled 2017-10-30: qty 1

## 2017-10-30 MED ORDER — LIDOCAINE HCL (PF) 2 % IJ SOLN
INTRAMUSCULAR | Status: AC
  Filled 2017-10-30: qty 10

## 2017-10-30 MED ORDER — PANTOPRAZOLE SODIUM 40 MG PO TBEC: 40.0000 mg | DELAYED_RELEASE_TABLET | Freq: Every day | ORAL | Status: DC

## 2017-10-30 MED ORDER — PROPOFOL 10 MG/ML IV BOLUS
INTRAVENOUS | Status: AC
  Filled 2017-10-30: qty 20

## 2017-10-30 MED ORDER — HYDROMORPHONE HCL 1 MG/ML IJ SOLN
0.5000 mg | INTRAMUSCULAR | Status: DC | PRN
  Administered 2017-10-30: 0.5 mg via INTRAVENOUS

## 2017-10-30 MED ORDER — KETOROLAC TROMETHAMINE 30 MG/ML IJ SOLN
30.0000 mg | Freq: Four times a day (QID) | INTRAMUSCULAR | Status: DC
  Administered 2017-10-30: 30 mg via INTRAVENOUS
  Filled 2017-10-30 (×2): qty 1

## 2017-10-30 MED ORDER — SENNOSIDES-DOCUSATE SODIUM 8.6-50 MG PO TABS: 1.0000 | ORAL_TABLET | Freq: Every evening | ORAL | Status: DC | PRN

## 2017-10-30 MED ORDER — ACETAMINOPHEN 10 MG/ML IV SOLN
INTRAVENOUS | Status: DC | PRN
  Administered 2017-10-30: 1000 mg via INTRAVENOUS

## 2017-10-30 MED ORDER — DEXAMETHASONE SODIUM PHOSPHATE 10 MG/ML IJ SOLN
INTRAMUSCULAR | Status: AC
  Filled 2017-10-30: qty 1

## 2017-10-30 MED ORDER — FENTANYL CITRATE (PF) 100 MCG/2ML IJ SOLN
INTRAMUSCULAR | Status: AC
  Filled 2017-10-30: qty 2

## 2017-10-30 MED ORDER — ACETAMINOPHEN NICU IV SYRINGE 10 MG/ML
INTRAVENOUS | Status: AC
  Filled 2017-10-30: qty 1

## 2017-10-30 MED ORDER — SUGAMMADEX SODIUM 500 MG/5ML IV SOLN
INTRAVENOUS | Status: AC
  Filled 2017-10-30: qty 5

## 2017-10-30 MED ORDER — FLUORESCEIN SODIUM 10 % IV SOLN
INTRAVENOUS | Status: DC | PRN
  Administered 2017-10-30: 25 mg via INTRAVENOUS

## 2017-10-30 MED ORDER — BUPIVACAINE HCL (PF) 0.5 % IJ SOLN
INTRAMUSCULAR | Status: DC | PRN
  Administered 2017-10-30: 10 mL

## 2017-10-30 MED ORDER — ACETAMINOPHEN 325 MG PO TABS: 650.0000 mg | ORAL_TABLET | ORAL | Status: DC | PRN

## 2017-10-30 MED ORDER — KETOROLAC TROMETHAMINE 30 MG/ML IJ SOLN
INTRAMUSCULAR | Status: AC
  Filled 2017-10-30: qty 1

## 2017-10-30 MED ORDER — BISACODYL 10 MG RE SUPP
10.0000 mg | Freq: Every day | RECTAL | Status: DC | PRN
  Filled 2017-10-30: qty 1

## 2017-10-30 MED ORDER — ONDANSETRON HCL 4 MG/2ML IJ SOLN
INTRAMUSCULAR | Status: DC | PRN
  Administered 2017-10-30: 4 mg via INTRAVENOUS

## 2017-10-30 MED ORDER — FENTANYL CITRATE (PF) 100 MCG/2ML IJ SOLN
INTRAMUSCULAR | Status: AC
  Administered 2017-10-30: 25 ug via INTRAVENOUS
  Filled 2017-10-30: qty 2

## 2017-10-30 MED ORDER — MIDAZOLAM HCL 2 MG/2ML IJ SOLN
INTRAMUSCULAR | Status: DC | PRN
  Administered 2017-10-30: 2 mg via INTRAVENOUS

## 2017-10-30 SURGICAL SUPPLY — 51 items
BAG URINE DRAINAGE (UROLOGICAL SUPPLIES) ×4 IMPLANT
BLADE SURG SZ11 CARB STEEL (BLADE) ×4 IMPLANT
CANISTER SUCT 1200ML W/VALVE (MISCELLANEOUS) ×4 IMPLANT
CATH FOLEY 2WAY  5CC 16FR (CATHETERS) ×2
CATH URTH 16FR FL 2W BLN LF (CATHETERS) ×2 IMPLANT
CHLORAPREP W/TINT 26ML (MISCELLANEOUS) ×4 IMPLANT
DEFOGGER SCOPE WARMER CLEARIFY (MISCELLANEOUS) ×4 IMPLANT
DERMABOND ADVANCED (GAUZE/BANDAGES/DRESSINGS) ×2
DERMABOND ADVANCED .7 DNX12 (GAUZE/BANDAGES/DRESSINGS) ×2 IMPLANT
DEVICE SUTURE ENDOST 10MM (ENDOMECHANICALS) ×4 IMPLANT
DRAPE CAMERA CLOSED 9X96 (DRAPES) ×4 IMPLANT
DRSG TEGADERM 2-3/8X2-3/4 SM (GAUZE/BANDAGES/DRESSINGS) ×16 IMPLANT
ENDOSTITCH 0 SINGLE 48 (SUTURE) ×4 IMPLANT
GAUZE SPONGE NON-WVN 2X2 STRL (MISCELLANEOUS) IMPLANT
GLOVE BIO SURGEON STRL SZ8 (GLOVE) ×20 IMPLANT
GLOVE INDICATOR 8.0 STRL GRN (GLOVE) ×4 IMPLANT
GOWN STRL REUS W/ TWL LRG LVL3 (GOWN DISPOSABLE) ×2 IMPLANT
GOWN STRL REUS W/ TWL XL LVL3 (GOWN DISPOSABLE) ×4 IMPLANT
GOWN STRL REUS W/TWL LRG LVL3 (GOWN DISPOSABLE) ×2
GOWN STRL REUS W/TWL XL LVL3 (GOWN DISPOSABLE) ×4
GRASPER SUT TROCAR 14GX15 (MISCELLANEOUS) ×4 IMPLANT
IRRIGATION STRYKERFLOW (MISCELLANEOUS) ×4 IMPLANT
IRRIGATOR STRYKERFLOW (MISCELLANEOUS) ×8
IV LACTATED RINGERS 1000ML (IV SOLUTION) ×8 IMPLANT
KIT PINK PAD W/HEAD ARE REST (MISCELLANEOUS) ×4
KIT PINK PAD W/HEAD ARM REST (MISCELLANEOUS) ×2 IMPLANT
KIT TURNOVER CYSTO (KITS) ×4 IMPLANT
LABEL OR SOLS (LABEL) ×4 IMPLANT
MANIPULATOR VCARE LG CRV RETR (MISCELLANEOUS) IMPLANT
MANIPULATOR VCARE SML CRV RETR (MISCELLANEOUS) IMPLANT
MANIPULATOR VCARE STD CRV RETR (MISCELLANEOUS) IMPLANT
NEEDLE VERESS 14GA 120MM (NEEDLE) ×4 IMPLANT
NS IRRIG 500ML POUR BTL (IV SOLUTION) ×4 IMPLANT
OCCLUDER COLPOPNEUMO (BALLOONS) ×4 IMPLANT
PACK GYN LAPAROSCOPIC (MISCELLANEOUS) ×4 IMPLANT
PAD OB MATERNITY 4.3X12.25 (PERSONAL CARE ITEMS) ×4 IMPLANT
PAD PREP 24X41 OB/GYN DISP (PERSONAL CARE ITEMS) ×4 IMPLANT
SCISSORS METZENBAUM CVD 33 (INSTRUMENTS) ×4 IMPLANT
SET CYSTO W/LG BORE CLAMP LF (SET/KITS/TRAYS/PACK) ×4 IMPLANT
SHEARS HARMONIC ACE PLUS 36CM (ENDOMECHANICALS) ×4 IMPLANT
SLEEVE ENDOPATH XCEL 5M (ENDOMECHANICALS) ×4 IMPLANT
SPONGE VERSALON 2X2 STRL (MISCELLANEOUS)
SUT ENDO VLOC 180-0-8IN (SUTURE) ×8 IMPLANT
SUT VIC AB 0 CT1 36 (SUTURE) ×4 IMPLANT
SUT VIC AB 4-0 FS2 27 (SUTURE) ×4 IMPLANT
SYR 10ML LL (SYRINGE) ×4 IMPLANT
SYR 50ML LL SCALE MARK (SYRINGE) ×4 IMPLANT
TROCAR 5M 150ML BLDLS (TROCAR) ×4 IMPLANT
TROCAR ENDO BLADELESS 11MM (ENDOMECHANICALS) ×4 IMPLANT
TROCAR XCEL NON-BLD 5MMX100MML (ENDOMECHANICALS) ×4 IMPLANT
TUBING INSUF HEATED (TUBING) ×4 IMPLANT

## 2017-10-30 NOTE — Progress Notes (Signed)
Day of Surgery Procedure(s) (LRB): HYSTERECTOMY TOTAL LAPAROSCOPIC BILATERAL SALPINGECTOMY (Bilateral) CYSTOSCOPY  Subjective: Patient reports R sided pain.  No void yet..    Objective: I have reviewed patient's vital signs, intake and output and medications.  Abd: Min T, ND Incision: clean, dry and intact Extr: no calf T, no edema  Assessment: s/p Procedure(s): HYSTERECTOMY TOTAL LAPAROSCOPIC BILATERAL SALPINGECTOMY (Bilateral) CYSTOSCOPY: stable  Plan: Advance diet Encourage ambulation Advance to PO medication Monitor first void.  Due to pain, will monitor overnight  LOS: 0 days    Misty LibraRobert Paul Adeleigh Barletta 10/30/2017, 3:08 PM

## 2017-10-30 NOTE — Discharge Instructions (Signed)

## 2017-10-30 NOTE — H&P (Signed)
History and Physical Interval Note:  10/30/2017 9:23 AM  Misty Larsen  has presented today for surgery, with the diagnosis of LOWER ABDOMINAL PAIN,ENDOMETRIAL THICKENING  The various methods of treatment have been discussed with the patient and family. After consideration of risks, benefits and other options for treatment, the patient has consented to  Procedure(s): HYSTERECTOMY TOTAL LAPAROSCOPIC BILATERAL SALPINGECTOMY (Bilateral) as a surgical intervention .  The patient's history has been reviewed, patient examined, no change in status, stable for surgery.  Pt has the following beta blocker history-  Not taking Beta Blocker.  I have reviewed the patient's chart and labs.  Questions were answered to the patient's satisfaction.    Annamarie MajorPaul Ebenezer Mccaskey, MD, Merlinda FrederickFACOG [facility], [facility] 10/30/2017  9:23 AM

## 2017-10-30 NOTE — Anesthesia Procedure Notes (Signed)
Procedure Name: Intubation Date/Time: 10/30/2017 10:17 AM Performed by: Hedda Slade, CRNA Pre-anesthesia Checklist: Patient identified, Patient being monitored, Timeout performed, Emergency Drugs available and Suction available Patient Re-evaluated:Patient Re-evaluated prior to induction Oxygen Delivery Method: Circle system utilized Preoxygenation: Pre-oxygenation with 100% oxygen Induction Type: IV induction Ventilation: Mask ventilation without difficulty and Oral airway inserted - appropriate to patient size Laryngoscope Size: 3 and McGraph Grade View: Grade I Tube type: Oral Tube size: 7.0 mm Number of attempts: 2 Airway Equipment and Method: Stylet Placement Confirmation: ETT inserted through vocal cords under direct vision,  positive ETCO2 and breath sounds checked- equal and bilateral Secured at: 22 cm Tube secured with: Tape Dental Injury: Teeth and Oropharynx as per pre-operative assessment  Difficulty Due To: Difficulty was anticipated and Difficult Airway- due to large tongue Future Recommendations: Recommend- induction with short-acting agent, and alternative techniques readily available Comments: Grade 4 view with MAC 3.  Easy atraumatic intubation with McGrath 3

## 2017-10-30 NOTE — Anesthesia Post-op Follow-up Note (Signed)
Anesthesia QCDR form completed.        

## 2017-10-30 NOTE — Op Note (Signed)
Operative Report:  PRE-OP DIAGNOSIS: LOWER ABDOMINAL PAIN,ENDOMETRIAL THICKENING   POST-OP DIAGNOSIS: LOWER ABDOMINAL PAIN,ENDOMETRIAL THICKENING   PROCEDURE: Procedure(s): HYSTERECTOMY TOTAL LAPAROSCOPIC BILATERAL SALPINGECTOMY CYSTOSCOPY  SURGEON: Annamarie Major, MD, FACOG  ASSISTANT: Dr Jean Rosenthal   ANESTHESIA: General endotracheal anesthesia  ESTIMATED BLOOD LOSS: 175 mL  SPECIMENS: Uterus, Tubes.  COMPLICATIONS: None  DISPOSITION: stable to PACU  FINDINGS: Intraabdominal adhesions were noted. Many adhesions noted with omentum to anterior abdominal wall and side wall and right adnexa.  Right ovary flush with side wall and not excised.  Left ovary normal.  Cervical stenosis noted.  PROCEDURE:  The patient was taken to the OR where anesthesia was administed. She was prepped and draped in the normal sterile fashion in the dorsal lithotomy position in the Kingston stirrups. A time out was performed. A Graves speculum was inserted, the cervix was grasped with a single tooth tenaculum and the endometrial cavity was unable to be sounded due to complete stenosis.  Gloves were changed and attention was turned to the abdomen.   An infraumbilical transverse 5mm skin incision was made with the scalpel after local anesthesia applied to the skin. A Veress-step needle was inserted in the usual fashion and confirmed using the hanging drop technique. A pneumoperitoneum was obtained by insufflation of CO2 (opening pressure of ) to . A diagnostic laparoscopy was performed yielding the previously described findings. Attention was turned to the left lower quadrant where after visualization of the inferior epigastric vessels a 5mm skin incision was made with the scalpel. A 5 mm laparoscopic port was inserted. The same procedure was repeated in the right lower quadrant with a 11mm trocar.  A suprapubic 5 mm port was also placed.  Lysis of adhesions is performed with the Harmonic Scapel to better  visualize the uterus and adnexa; no bowel is encountered.  No bleeding.     Attention was turned to the left aspect of the uterus, where after visualization of the ureter, the round ligament was coagulated and transected using the 5mm Harmonic Scapel. The anterior and posterior leafs of the broad ligament were dissected off as the anterior one was coagulated and transected in a caudal direction towards the cuff of the uterine manipulator.  Attention was then turned to the left fallopian tube which was recognized by visualization of the fimbria. The tube is excised to its attachment to the uterus. The uterine-ovarian ligament and its blood vessels were carefully coagulated and transected using the Harmonic scapel.  Attention was turned to the right aspect of the uterus where the same procedure was performed.  The vesicouterine reflection of the peritoneum was dissected with the harmonic scapel and the bladder flap was created bluntly.  The uterine vessels were coagulated and transected bilaterally using first bipolar cautery and then the harmonic scapel. A 360 degree, circumferential colpotomy was done to completely amputate the uterus with cervix and tubes. Once the specimen was amputated it was delivered through the vagina.   The colpotomy was repaired in a running fashion using a delayed absorbable suture with an endo-stitch device.  Vaginal exam confirms complete closure.  The cavity was copiously irrigated. A survey of the pelvic cavity revealed adequate hemostasis and no injury to bowel, bladder, or ureter.   A diagnostic cystoscopy was performed using saline distension of bladder with no lesions or injuries noted.  Bilateral urine flow from each ureteral orifice is visualized.  Fluoucine dye is used to aid visualization.  At this point the procedure was finalized. All the instruments  were removed from the patient's body. Gas was expelled and patient is leveled.  Incisions are closed with skin adhesive.     Patient goes to recovery room in stable condition.  All sponge, instrument, and needle counts are correct x2.     Annamarie MajorPaul Loys Shugars, MD, Merlinda FrederickFACOG [facility], [facility] 10/30/2017  12:54 PM

## 2017-10-30 NOTE — Anesthesia Postprocedure Evaluation (Signed)
Anesthesia Post Note  Patient: Misty Larsen  Procedure([place]) Performed: HYSTERECTOMY TOTAL LAPAROSCOPIC BILATERAL SALPINGECTOMY (Bilateral ) CYSTOSCOPY  Patient location during evaluation: PACU Anesthesia Type: General Level of consciousness: awake and alert Pain management: pain level controlled Vital Signs Assessment: post-procedure vital signs reviewed and stable Respiratory status: spontaneous breathing, nonlabored ventilation, respiratory function stable and patient connected to nasal cannula oxygen Cardiovascular status: blood pressure returned to baseline and stable Postop Assessment: no apparent nausea or vomiting Anesthetic complications: no     Last Vitals:  Vitals:   10/30/17 1450 10/30/17 1550  BP: (!) 153/79 (!) 146/77  Pulse: 79 80  Resp: 20 18  Temp: 36.6 C 36.7 C  SpO2: 100% 95%    Last Pain:  Vitals:   10/30/17 1550  TempSrc: Oral  PainSc:                  Misty Larsen

## 2017-10-30 NOTE — Anesthesia Preprocedure Evaluation (Signed)
Anesthesia Evaluation  Patient identified by MRN, date of birth, ID band Patient awake    Reviewed: Allergy & Precautions, NPO status , Patient's Chart, lab work & pertinent test results, reviewed documented beta blocker date and time   Airway Mallampati: III  TM Distance: >3 FB     Dental  (+) Chipped   Pulmonary pneumonia, resolved, former smoker,           Cardiovascular hypertension, Pt. on medications and Pt. on home beta blockers      Neuro/Psych    GI/Hepatic hiatal hernia, GERD  Controlled,  Endo/Other  diabetes, Type 2Hypothyroidism Morbid obesity  Renal/GU      Musculoskeletal  (+) Arthritis ,   Abdominal   Peds  Hematology   Anesthesia Other Findings HIV.  Reproductive/Obstetrics                             Anesthesia Physical Anesthesia Plan  ASA: III  Anesthesia Plan: General   Post-op Pain Management:    Induction: Intravenous  PONV Risk Score and Plan:   Airway Management Planned: Oral ETT  Additional Equipment:   Intra-op Plan:   Post-operative Plan:   Informed Consent: I have reviewed the patients History and Physical, chart, labs and discussed the procedure including the risks, benefits and alternatives for the proposed anesthesia with the patient or authorized representative who has indicated his/her understanding and acceptance.     Plan Discussed with: CRNA  Anesthesia Plan Comments:         Anesthesia Quick Evaluation

## 2017-10-30 NOTE — Transfer of Care (Signed)
Immediate Anesthesia Transfer of Care Note  Patient: Misty Larsen  Procedure(s) Performed: HYSTERECTOMY TOTAL LAPAROSCOPIC BILATERAL SALPINGECTOMY (Bilateral ) CYSTOSCOPY  Patient Location: PACU  Anesthesia Type:General  Level of Consciousness: awake, alert  and oriented  Airway & Oxygen Therapy: Patient Spontanous Breathing and Patient connected to face mask oxygen  Post-op Assessment: Report given to RN and Post -op Vital signs reviewed and stable  Post vital signs: Reviewed and stable  Last Vitals:  Vitals:   10/30/17 0859 10/30/17 1310  BP: 127/78 (!) 154/66  Pulse: 84 88  Resp: 16 16  Temp: 36.8 C 36.7 C  SpO2: 99% 100%    Last Pain:  Vitals:   10/30/17 0859  TempSrc: Oral         Complications: No apparent anesthesia complications

## 2017-10-30 NOTE — OR Nursing (Signed)
ALL BELONGINGS TAKEN TO PACU AT PATIENT'S REQUEST "IN CASE I NEED SOMETHING"

## 2017-10-31 DIAGNOSIS — R9389 Abnormal findings on diagnostic imaging of other specified body structures: Secondary | ICD-10-CM | POA: Diagnosis not present

## 2017-10-31 DIAGNOSIS — E119 Type 2 diabetes mellitus without complications: Secondary | ICD-10-CM | POA: Diagnosis not present

## 2017-10-31 DIAGNOSIS — Z882 Allergy status to sulfonamides status: Secondary | ICD-10-CM | POA: Diagnosis not present

## 2017-10-31 DIAGNOSIS — N888 Other specified noninflammatory disorders of cervix uteri: Secondary | ICD-10-CM | POA: Diagnosis not present

## 2017-10-31 DIAGNOSIS — K449 Diaphragmatic hernia without obstruction or gangrene: Secondary | ICD-10-CM | POA: Diagnosis not present

## 2017-10-31 DIAGNOSIS — Z96642 Presence of left artificial hip joint: Secondary | ICD-10-CM | POA: Diagnosis not present

## 2017-10-31 DIAGNOSIS — I10 Essential (primary) hypertension: Secondary | ICD-10-CM | POA: Diagnosis not present

## 2017-10-31 DIAGNOSIS — Z79890 Hormone replacement therapy (postmenopausal): Secondary | ICD-10-CM | POA: Diagnosis not present

## 2017-10-31 DIAGNOSIS — E039 Hypothyroidism, unspecified: Secondary | ICD-10-CM | POA: Diagnosis not present

## 2017-10-31 DIAGNOSIS — R102 Pelvic and perineal pain: Secondary | ICD-10-CM | POA: Diagnosis not present

## 2017-10-31 DIAGNOSIS — N840 Polyp of corpus uteri: Secondary | ICD-10-CM | POA: Diagnosis not present

## 2017-10-31 DIAGNOSIS — M19071 Primary osteoarthritis, right ankle and foot: Secondary | ICD-10-CM | POA: Diagnosis not present

## 2017-10-31 DIAGNOSIS — Z87891 Personal history of nicotine dependence: Secondary | ICD-10-CM | POA: Diagnosis not present

## 2017-10-31 DIAGNOSIS — K219 Gastro-esophageal reflux disease without esophagitis: Secondary | ICD-10-CM | POA: Diagnosis not present

## 2017-10-31 DIAGNOSIS — R1030 Lower abdominal pain, unspecified: Secondary | ICD-10-CM | POA: Diagnosis not present

## 2017-10-31 DIAGNOSIS — K660 Peritoneal adhesions (postprocedural) (postinfection): Secondary | ICD-10-CM | POA: Diagnosis not present

## 2017-10-31 DIAGNOSIS — Z79899 Other long term (current) drug therapy: Secondary | ICD-10-CM | POA: Diagnosis not present

## 2017-10-31 DIAGNOSIS — M19072 Primary osteoarthritis, left ankle and foot: Secondary | ICD-10-CM | POA: Diagnosis not present

## 2017-10-31 DIAGNOSIS — Z86010 Personal history of colonic polyps: Secondary | ICD-10-CM | POA: Diagnosis not present

## 2017-10-31 DIAGNOSIS — Z7984 Long term (current) use of oral hypoglycemic drugs: Secondary | ICD-10-CM | POA: Diagnosis not present

## 2017-10-31 DIAGNOSIS — Z9889 Other specified postprocedural states: Secondary | ICD-10-CM | POA: Diagnosis not present

## 2017-10-31 NOTE — Discharge Summary (Signed)
Gynecology Physician Postoperative Discharge Summary  Patient ID: Misty Larsen MRN: [id number] DOB/AGE: 1966/05/17 52 y.o.  Admit Date: 10/30/2017 Discharge Date: 10/31/2017  Preoperative Diagnoses: Endometrial thickening  Procedures: Procedure(s) (LRB): HYSTERECTOMY TOTAL LAPAROSCOPIC BILATERAL SALPINGECTOMY (Bilateral) CYSTOSCOPY  Significant Labs: CBC Latest Ref Rng & Units 10/29/2017 08/08/2017 06/25/2017  WBC 3.6 - 11.0 K/uL 7.9 9.4 8.0  Hemoglobin 12.0 - 16.0 g/dL 40.914.6 81.115.0 91.414.8  Hematocrit 35.0 - 47.0 % 44.2 46.3 44.5  Platelets 150 - 440 K/uL 229 231 233    Hospital Course:  Misty Larsen is a [age] y.o. G0P0000  admitted for scheduled surgery.  She underwent the procedures as mentioned above, her operation was uncomplicated. For further details about surgery, please refer to the operative report. Patient had an uncomplicated postoperative course. By time of discharge on POD#1, her pain was controlled on oral pain medications; she was ambulating, voiding without difficulty, tolerating regular diet and passing flatus. She was deemed stable for discharge to home.   Discharge Exam: Blood pressure (!) 147/77, pulse 84, temperature 98.6 F (37 C), temperature source Oral, resp. rate 18, height 5\' 7"  (1.702 m), weight 258 lb (117 kg), last menstrual period 07/05/2011, SpO2 98 %. General appearance: alert and no distress  Resp: clear to auscultation bilaterally  Cardio: regular rate and rhythm  GI: soft, non-tender; bowel sounds normal; no masses, no organomegaly.  Incision: C/D/I, no erythema, no drainage noted Pelvic: scant blood on pad  Extremities: extremities normal, atraumatic, no cyanosis or edema and Homans sign is negative, no sign of DVT  Discharged Condition: Stable  Disposition: 01-Home or Self Care  Discharge Instructions    Call MD for:  persistant nausea and vomiting   Complete by:  As directed    Call MD for:  redness, tenderness, or signs of infection (pain,  swelling, redness, odor or green/yellow discharge around incision site)   Complete by:  As directed    Call MD for:  severe uncontrolled pain   Complete by:  As directed    Call MD for:  temperature >100.4   Complete by:  As directed    Change dressing (specify)   Complete by:  As directed    Dressing change: remove any dressings tomorrow   Diet general   Complete by:  As directed    Discharge instructions   Complete by:  As directed    Resume activities according to discharge instruction sheets   Increase activity slowly   Complete by:  As directed      Allergies as of 10/31/2017      Reactions   Sulfa Antibiotics Other (See Comments)   Unknown      Medication List    STOP taking these medications   REPLENS Gel     TAKE these medications   abacavir-dolutegravir-lamiVUDine 600-50-300 MG tablet Commonly known as:  TRIUMEQ Take 1 tablet by mouth every morning.   ergocalciferol 50000 units capsule Commonly known as:  VITAMIN D2 Take 50,000 Units by mouth every Wednesday.   Lancets 30G Misc Blood sugar testing done QAM prior to meals - dx E11.65   levothyroxine 50 MCG tablet Commonly known as:  SYNTHROID, LEVOTHROID Take 50 mcg by mouth daily before breakfast.   losartan-hydrochlorothiazide 100-12.5 MG tablet Commonly known as:  HYZAAR Take 1 tablet by mouth daily.   metFORMIN 500 MG tablet Commonly known as:  GLUCOPHAGE TAKE 1/2 TABLET BY MOUTH TWICE A DAY   metoprolol tartrate 50 MG tablet Commonly known as:  LOPRESSOR Take  50 mg by mouth 2 (two) times daily.   omeprazole 20 MG capsule Commonly known as:  PRILOSEC Take 20 mg by mouth daily as needed (acid reflux).   oxyCODONE-acetaminophen 5-325 MG tablet Commonly known as:  PERCOCET Take 1 tablet by mouth every 4 (four) hours as needed for moderate pain or severe pain.   rosuvastatin 5 MG tablet Commonly known as:  CRESTOR Take 5 mg by mouth at bedtime.            Discharge Care Instructions   (From admission, onward)        Start     Ordered   10/31/17 0000  Change dressing (specify)    Comments:  Dressing change: remove any dressings tomorrow   10/31/17 1042     Follow-up Information    Nadara Mustard, MD. Go in 2 week(s).   Specialty:  Obstetrics and Gynecology Contact information: [address] [place] [address] [phone]           Annamarie Major, MD

## 2017-10-31 NOTE — Progress Notes (Signed)
Discharge order received from doctor.  Reviewed discharge instructions and prescriptions with patient and answered all questions. Follow up appointment given. Patient verbalized understanding. Patient discharged home via wheelchair by nursing/auxillary.    Naw Lasala Garner, RN  

## 2017-11-04 LAB — SURGICAL PATHOLOGY

## 2017-11-05 ENCOUNTER — Telehealth: Payer: Self-pay | Admitting: Obstetrics & Gynecology

## 2017-11-05 NOTE — Telephone Encounter (Signed)
Patient called to say she is having problems with urine leakage at night, feels like her body is confused, and would like a call back.

## 2017-11-05 NOTE — Telephone Encounter (Signed)
Pt states she is fine during the day time.  Last night she had a accident and urinated on her self and didn't even know it. She had no warning she needed to urinate, just woke up wet. Does she need to be seen for this or is this something that will be normal in time?

## 2017-11-06 NOTE — Telephone Encounter (Signed)
lmtrc

## 2017-11-06 NOTE — Telephone Encounter (Signed)
OTC Anusol HC cream

## 2017-11-06 NOTE — Telephone Encounter (Signed)
Too soon after surgery to draw conclusions, would wait and see at follow up appointments

## 2017-11-06 NOTE — Telephone Encounter (Signed)
Pt aware.

## 2017-11-06 NOTE — Telephone Encounter (Signed)
Pt is returning missed call. Please advise °

## 2017-11-06 NOTE — Telephone Encounter (Signed)
I forgot to mention pt has a  hemorrhoid she thinks . Do you want to see her for this or call in some medication?

## 2017-11-07 ENCOUNTER — Other Ambulatory Visit: Payer: Self-pay

## 2017-11-07 MED ORDER — LOSARTAN POTASSIUM-HCTZ 100-12.5 MG PO TABS: 1.0000 | ORAL_TABLET | Freq: Every day | ORAL | 1 refills | Status: DC

## 2017-11-11 ENCOUNTER — Telehealth: Payer: Self-pay | Admitting: Obstetrics & Gynecology

## 2017-11-11 NOTE — Telephone Encounter (Signed)
Overactive bladder after surgery is expected, and can medicate if aggravating.  Should improve w time.  This is better than urinary retention (inability to go). Let me know if desires pill.  Side effect- dry mouth.

## 2017-11-11 NOTE — Telephone Encounter (Signed)
lmtrc

## 2017-11-11 NOTE — Telephone Encounter (Signed)
Any advice I can give Pt?

## 2017-11-11 NOTE — Telephone Encounter (Signed)
Patient called to say she is still having problems with urination, going every 10 min during the day, and a little longer at night, and has to wear a pad at night. Patient would like someone to call back.

## 2017-11-12 ENCOUNTER — Ambulatory Visit: Payer: Medicare Other | Admitting: Obstetrics & Gynecology

## 2017-11-12 ENCOUNTER — Other Ambulatory Visit: Payer: Self-pay | Admitting: Obstetrics & Gynecology

## 2017-11-12 MED ORDER — OXYBUTYNIN CHLORIDE ER 10 MG PO TB24: 10.0000 mg | ORAL_TABLET | Freq: Every day | ORAL | 6 refills | Status: DC

## 2017-11-12 MED ORDER — TOLTERODINE TARTRATE ER 2 MG PO CP24: 4.0000 mg | ORAL_CAPSULE | Freq: Every day | ORAL | 3 refills | Status: DC

## 2017-11-12 NOTE — Telephone Encounter (Signed)
lmtrc

## 2017-11-12 NOTE — Telephone Encounter (Signed)
Pt would like to try the medication

## 2017-11-14 NOTE — Telephone Encounter (Signed)
Pt is needing to speak with [name].

## 2017-11-17 ENCOUNTER — Ambulatory Visit (INDEPENDENT_AMBULATORY_CARE_PROVIDER_SITE_OTHER): Payer: Medicare Other | Admitting: Obstetrics & Gynecology

## 2017-11-17 ENCOUNTER — Encounter: Payer: Self-pay | Admitting: Obstetrics & Gynecology

## 2017-11-17 VITALS — BP 120/80 | HR 93 | Ht 67.0 in | Wt 250.0 lb

## 2017-11-17 DIAGNOSIS — R102 Pelvic and perineal pain: Secondary | ICD-10-CM

## 2017-11-17 DIAGNOSIS — N3000 Acute cystitis without hematuria: Secondary | ICD-10-CM | POA: Diagnosis not present

## 2017-11-17 DIAGNOSIS — G8929 Other chronic pain: Secondary | ICD-10-CM

## 2017-11-17 DIAGNOSIS — R350 Frequency of micturition: Secondary | ICD-10-CM

## 2017-11-17 DIAGNOSIS — R9389 Abnormal findings on diagnostic imaging of other specified body structures: Secondary | ICD-10-CM

## 2017-11-17 LAB — POCT URINALYSIS DIPSTICK
BILIRUBIN UA: NEGATIVE
Blood, UA: POSITIVE
GLUCOSE UA: NEGATIVE
KETONES UA: NEGATIVE
Nitrite, UA: NEGATIVE
Protein, UA: NEGATIVE
Spec Grav, UA: 1.015 (ref 1.010–1.025)
Urobilinogen, UA: 1 E.U./dL
pH, UA: 6.5 (ref 5.0–8.0)

## 2017-11-17 MED ORDER — NITROFURANTOIN MONOHYD MACRO 100 MG PO CAPS: 100.0000 mg | ORAL_CAPSULE | Freq: Two times a day (BID) | ORAL | 1 refills | Status: DC

## 2017-11-17 NOTE — Progress Notes (Signed)
  Postoperative Follow-up Patient [facility], 2 weeks ago.  Pathology: DIAGNOSIS:  A. UTERUS WITH CERVIX AND BILATERAL FALLOPIAN TUBES; HYSTERECTOMY WITH  BILATERAL SALPINGECTOMY:  - NABOTHIAN CYSTS.  - BENIGN ENDOMETRIAL POLYP (4.5 CM).  - BACKGROUND DISORDERED PROLIFERATIVE ENDOMETRIUM.  - BILATERAL FALLOPIAN TUBES WITHOUT PATHOLOGIC CHANGE.  - NEGATIVE FOR ATYPIA AND MALIGNANCY.   Subjective: Patient reports marked improvement in her preop symptoms. Eating a regular diet without difficulty. The patient is not having any pain.  Activity: normal activities of daily living. Patient reports vaginal sx's of No bleeding.  PT HAS HAD URINARY FREQUENCY since surgery and Ditropan helps for just a few hours.  Wears pads, depends.    Objective: BP 120/80   Pulse 93   Ht 5\' 7"  (1.702 m)   Wt 250 lb (113.4 kg)   LMP 07/05/2011 (Approximate) Comment: states "it's been about 9 years"  BMI 39.16 kg/m  OBGyn Exam Results for orders placed or performed in visit on 11/17/17  POCT urinalysis dipstick  Result Value Ref Range   Color, UA     Clarity, UA     Glucose, UA neg    Bilirubin, UA neg    Ketones, UA neg    Spec Grav, UA 1.015 1.010 - 1.025   Blood, UA positive    pH, UA 6.5 5.0 - 8.0   Protein, UA neg    Urobilinogen, UA 1.0 0.2 or 1.0 E.U./dL   Nitrite, UA neg    Leukocytes, UA Large (3+) (A) Negative   Appearance     Odor     Assessment: 1. UTI 2. s/p :  total laparoscopic hysterectomy with bilateral salpingectomy stable  Plan: Patient has done well after surgery with no apparent complications.  I have discussed the post-operative course to date, and the expected progress moving forward.  The patient understands what complications to be concerned about.  I will see the patient in routine follow up, or sooner if needed.    Activity plan: No heavy lifting.Pelvic rest Myrbetriq samples for OAB (28 pills, due to cost factor for other  meds).  SE discussed.    Stop Ditropan Macrobid for UTI OOW for 2 more weeks due to OAB/ post surgery.  Note.  Misty Larsen 11/17/2017, 10:52 AM

## 2017-11-17 NOTE — Patient Instructions (Signed)
Nitrofurantoin tablets or capsules What is this medicine? NITROFURANTOIN (nye troe fyoor AN toyn) is an antibiotic. It is used to treat urinary tract infections. This medicine may be used for other purposes; ask your health care provider or pharmacist if you have questions. COMMON BRAND NAME(S): Macrobid, Macrodantin, Urotoin What should I tell my health care provider before I take this medicine? They need to know if you have any of these conditions: -anemia -diabetes -glucose-6-phosphate dehydrogenase deficiency -kidney disease -liver disease -lung disease -other chronic illness -an unusual or allergic reaction to nitrofurantoin, other antibiotics, other medicines, foods, dyes or preservatives -pregnant or trying to get pregnant -breast-feeding How should I use this medicine? Take this medicine by mouth with a glass of water. Follow the directions on the prescription label. Take this medicine with food or milk. Take your doses at regular intervals. Do not take your medicine more often than directed. Do not stop taking except on your doctor's advice. Talk to your pediatrician regarding the use of this medicine in children. While this drug may be prescribed for selected conditions, precautions do apply. Overdosage: If you think you have taken too much of this medicine contact a poison control center or emergency room at once. NOTE: This medicine is only for you. Do not share this medicine with others. What if I miss a dose? If you miss a dose, take it as soon as you can. If it is almost time for your next dose, take only that dose. Do not take double or extra doses. What may interact with this medicine? -antacids containing magnesium trisilicate -probenecid -quinolone antibiotics like ciprofloxacin, lomefloxacin, norfloxacin and ofloxacin -sulfinpyrazone This list may not describe all possible interactions. Give your health care provider a list of all the medicines, herbs,  non-prescription drugs, or dietary supplements you use. Also tell them if you smoke, drink alcohol, or use illegal drugs. Some items may interact with your medicine. What should I watch for while using this medicine? Tell your doctor or health care professional if your symptoms do not improve or if you get new symptoms. Drink several glasses of water a day. If you are taking this medicine for a long time, visit your doctor for regular checks on your progress. If you are diabetic, you may get a false positive result for sugar in your urine with certain brands of urine tests. Check with your doctor. What side effects may I notice from receiving this medicine? Side effects that you should report to your doctor or health care professional as soon as possible: -allergic reactions like skin rash or hives, swelling of the face, lips, or tongue -chest pain -cough -difficulty breathing -dizziness, drowsiness -fever or infection -joint aches or pains -pale or blue-tinted skin -redness, blistering, peeling or loosening of the skin, including inside the mouth -tingling, burning, pain, or numbness in hands or feet -unusual bleeding or bruising -unusually weak or tired -yellowing of eyes or skin Side effects that usually do not require medical attention (report to your doctor or health care professional if they continue or are bothersome): -dark urine -diarrhea -headache -loss of appetite -nausea or vomiting -temporary hair loss This list may not describe all possible side effects. Call your doctor for medical advice about side effects. You may report side effects to FDA at 1-800-FDA-1088. Where should I keep my medicine? Keep out of the reach of children. Store at room temperature between 15 and 30 degrees C (59 and 86 degrees F). Protect from light. Throw away any unused   medicine after the expiration date. NOTE: This sheet is a summary. It may not cover all possible information. If you have  questions about this medicine, talk to your doctor, pharmacist, or health care provider.  2018 Elsevier/Gold Standard (2008-04-06 15:56:47)  

## 2017-11-24 ENCOUNTER — Telehealth: Payer: Self-pay

## 2017-11-24 ENCOUNTER — Other Ambulatory Visit: Payer: Self-pay | Admitting: Obstetrics & Gynecology

## 2017-11-24 DIAGNOSIS — R32 Unspecified urinary incontinence: Secondary | ICD-10-CM

## 2017-11-24 MED ORDER — CIPROFLOXACIN HCL 500 MG PO TABS: 500.0000 mg | ORAL_TABLET | Freq: Two times a day (BID) | ORAL | 0 refills | Status: DC

## 2017-11-24 NOTE — Telephone Encounter (Signed)
Pt states her urine is still cloudy she needs a different antibiotic, she needs to go back to work, supposed to next week. She also wants to know how long do you think its should take to be normal. She will do the referral but suggest a female Dr. Has had a bad experience with female urologist. Please advise.

## 2017-11-24 NOTE — Telephone Encounter (Signed)
Pt aware.

## 2017-11-24 NOTE — Telephone Encounter (Signed)
Referral order put in for Misty LandauNancy Lets see what she says Can change ABX, order in

## 2017-11-24 NOTE — Telephone Encounter (Signed)
Pt thinks she may need to be seen- new pill not working, peeing a lot, last night she had cramps in the bottom of her stomach, steady getting up all night peeing, still having leakage.  States she needs to go back to work.  Ph needs to do something, she doesn't know what.  [phone]

## 2017-11-24 NOTE — Telephone Encounter (Signed)
Would wait it out still, but otherwise can set up Urology referral if desired.

## 2017-11-25 ENCOUNTER — Telehealth: Payer: Self-pay | Admitting: Internal Medicine

## 2017-11-25 NOTE — Telephone Encounter (Signed)
Pt needs to compliant with her ID app Please do not refill antiviral meds

## 2017-12-01 ENCOUNTER — Telehealth: Payer: Self-pay

## 2017-12-01 NOTE — Telephone Encounter (Signed)
Pt requesting Shanda BumpsJessica return her call when she gets a chance. She needs to talk to her about something. No other details given. RU#[id number]#[phone]

## 2017-12-01 NOTE — Telephone Encounter (Signed)
Pt states [facility] seemed to be making her use the bathroom more. She stopped taking it on Saturday. She seems to be using the bathroom normally now w/o leakage and she isn't having to use the depends. She isn't planning on keeping her urology apt since she is doing well now. Just FYI per patient.

## 2017-12-08 ENCOUNTER — Other Ambulatory Visit: Payer: Self-pay | Admitting: Internal Medicine

## 2017-12-08 ENCOUNTER — Ambulatory Visit: Payer: Self-pay | Admitting: Urology

## 2017-12-08 ENCOUNTER — Encounter: Payer: Self-pay | Admitting: Urology

## 2017-12-15 ENCOUNTER — Encounter: Payer: Self-pay | Admitting: Obstetrics & Gynecology

## 2017-12-15 ENCOUNTER — Ambulatory Visit (INDEPENDENT_AMBULATORY_CARE_PROVIDER_SITE_OTHER): Payer: Medicare Other | Admitting: Obstetrics & Gynecology

## 2017-12-15 VITALS — BP 120/70 | HR 76 | Ht 67.0 in | Wt 249.0 lb

## 2017-12-15 DIAGNOSIS — R9389 Abnormal findings on diagnostic imaging of other specified body structures: Secondary | ICD-10-CM

## 2017-12-15 DIAGNOSIS — Z1239 Encounter for other screening for malignant neoplasm of breast: Secondary | ICD-10-CM

## 2017-12-15 DIAGNOSIS — Z1211 Encounter for screening for malignant neoplasm of colon: Secondary | ICD-10-CM

## 2017-12-15 DIAGNOSIS — Z1231 Encounter for screening mammogram for malignant neoplasm of breast: Secondary | ICD-10-CM

## 2017-12-15 NOTE — Progress Notes (Signed)
  Postoperative Follow-up Patient presents post [facility] for pelvic pain and endometrial thickening, polyps, 6 weeks ago.  Subjective: Patient reports marked improvement in her preop symptoms. Eating a regular diet without difficulty. The patient is not having any pain.  Activity: normal activities of daily living. Patient reports vaginal sx's of None  Objective: BP 120/70   Pulse 76   Ht 5\' 7"  (1.702 m)   Wt 249 lb (112.9 kg)   LMP 07/05/2011 (Approximate) Comment: states "it's been about 9 years"  BMI 39.00 kg/m  Physical Exam  Constitutional: She is oriented to person, place, and time. She appears well-developed and well-nourished. No distress.  Genitourinary: Rectum normal and vagina normal. Pelvic exam was performed with patient supine. There is no rash, tenderness or lesion on the right labia. There is no rash, tenderness or lesion on the left labia. No erythema or bleeding in the vagina. Right adnexum does not display mass and does not display tenderness. Left adnexum does not display mass and does not display tenderness.  Genitourinary Comments: Cervix and uterus absent. Vaginal cuff healing well.  Cardiovascular: Normal rate.  Pulmonary/Chest: Effort normal.  Abdominal: Soft. She exhibits no distension. There is no tenderness.  Incision healing well.  Musculoskeletal: Normal range of motion.  Neurological: She is alert and oriented to person, place, and time. No cranial nerve deficit.  Skin: Skin is warm and dry.  Psychiatric: She has a normal mood and affect.   Assessment: s/p :  total laparoscopic hysterectomy with bilateral salpingectomy progressing well  Plan: Patient has done well after surgery with no apparent complications.  I have discussed the post-operative course to date, and the expected progress moving forward.  The patient understands what complications to be concerned about.  I will see the patient in routine follow up, or sooner if needed.    Activity  plan: No restriction.  MMG and colonoscopy due.  Letitia Libraobert Paul Carlesha Seiple 12/15/2017, 2:40 PM

## 2017-12-15 NOTE — Patient Instructions (Signed)
Mammogram every year    Call [phone] to [facility] Colonoscopy every 10 years

## 2017-12-21 ENCOUNTER — Other Ambulatory Visit: Payer: Self-pay | Admitting: Internal Medicine

## 2017-12-24 ENCOUNTER — Ambulatory Visit (INDEPENDENT_AMBULATORY_CARE_PROVIDER_SITE_OTHER): Payer: Medicare Other | Admitting: Obstetrics & Gynecology

## 2017-12-24 ENCOUNTER — Telehealth: Payer: Self-pay | Admitting: Obstetrics & Gynecology

## 2017-12-24 ENCOUNTER — Encounter: Payer: Self-pay | Admitting: Obstetrics & Gynecology

## 2017-12-24 ENCOUNTER — Other Ambulatory Visit: Payer: Self-pay | Admitting: Obstetrics & Gynecology

## 2017-12-24 VITALS — BP 120/80 | HR 75 | Ht 67.0 in | Wt 246.0 lb

## 2017-12-24 DIAGNOSIS — R350 Frequency of micturition: Secondary | ICD-10-CM | POA: Diagnosis not present

## 2017-12-24 DIAGNOSIS — R300 Dysuria: Secondary | ICD-10-CM

## 2017-12-24 LAB — POCT URINALYSIS DIPSTICK
Bilirubin, UA: NEGATIVE
Glucose, UA: NEGATIVE
Ketones, UA: NEGATIVE
LEUKOCYTES UA: NEGATIVE
NITRITE UA: NEGATIVE
PH UA: 7 (ref 5.0–8.0)
RBC UA: POSITIVE
Spec Grav, UA: 1.015 (ref 1.010–1.025)
UROBILINOGEN UA: 1 U/dL

## 2017-12-24 MED ORDER — OXYBUTYNIN CHLORIDE ER 15 MG PO TB24: 15.0000 mg | ORAL_TABLET | Freq: Every day | ORAL | 11 refills | Status: DC

## 2017-12-24 NOTE — Telephone Encounter (Signed)
[facility]

## 2017-12-24 NOTE — Telephone Encounter (Signed)
Patient is calling needing to speak with Dr. Tiburcio PeaHarris nurse about an appt she is schedule for . Patient is reporting light bleeding with Clots.

## 2017-12-24 NOTE — Progress Notes (Signed)
  HPI:      [name] is a 52 y.o. G0P0000 who LMP was years ago, presents today for a problem visit.  She had TLH 7 weeks ago.  Recent suprapubic pressure and pain w urination as well as frequency.  Also noted scant vag bleeding yesterday.  Pt has not had exam since surgery as she refuses metal speculum exams.  Denies fever, chills, n/v.  PMHx: She  has a past medical history of Arthritis, Diabetes mellitus without complication (HCC), GERD (gastroesophageal reflux disease), History of hiatal hernia, HIV (human immunodeficiency virus infection) (HCC), Hypertension, Hypothyroidism, and Pneumonia (2007). Also,  has a past surgical history that includes Cryotherapy (2013-2014); Colonoscopy (2014); Joint replacement (Left, 2011); Breast surgery; Carpal tunnel release (2012); Cervix surgery (2013); Biopsy thyroid; Laparoscopic hysterectomy (Bilateral, 10/30/2017); and Cystoscopy (10/30/2017)., family history includes Breast cancer in her mother.,  reports that she quit smoking about a year ago. Her smoking use included cigarettes. She started smoking about 42 years ago. She has a 20.50 pack-year smoking history. She has never used smokeless tobacco. She reports that she drinks alcohol. She reports that she does not use drugs.  She has a current medication list which includes the following prescription(s): abacavir-dolutegravir-lamivudine, ciprofloxacin, lancets 30g, levothyroxine, losartan-hydrochlorothiazide, metformin, metoprolol tartrate, omeprazole, oxybutynin, oxycodone-acetaminophen, rosuvastatin, and vitamin d (ergocalciferol). Also, is allergic to sulfa antibiotics.  Review of Systems  All other systems reviewed and are negative.  Objective: BP 120/80   Pulse 75   Ht 5\' 7"  (1.702 m)   Wt 246 lb (111.6 kg)   LMP 07/05/2011 (Approximate) Comment: states "it's been about 9 years"  BMI 38.53 kg/m  Physical Exam  Constitutional: She is oriented to person, place, and time. She appears  well-developed and well-nourished. No distress.  Musculoskeletal: Normal range of motion.  Neurological: She is alert and oriented to person, place, and time.  Skin: Skin is warm and dry.  Psychiatric: She has a normal mood and affect.  Vitals reviewed.  Results for orders placed or performed in visit on 12/24/17  POCT urinalysis dipstick  Result Value Ref Range   Color, UA red    Clarity, UA     Glucose, UA neg    Bilirubin, UA neg    Ketones, UA neg    Spec Grav, UA 1.015 1.010 - 1.025   Blood, UA positive    pH, UA 7.0 5.0 - 8.0   Protein, UA 2+    Urobilinogen, UA 1.0 0.2 or 1.0 E.U./dL   Nitrite, UA neg    Leukocytes, UA Negative Negative   Appearance     Odor     ASSESSMENT/PLAN:   No signs of UTI today Urinary frequency after surgery Vaginal bleeding s/p hysterectomy.  I would like to evaluate vaginal cuff but she refuses based on speculums we have in office, says she prefers plastic ones and will not allow check today.  Pt told to monitor bleeding and not be sexually active (she reports she isn't at this time anyway). If persists then will need to do exam.  Annamarie MajorPaul Belkis Norbeck, MD, Merlinda FrederickFACOG [facility], [facility] 12/24/2017  10:19 AM

## 2017-12-26 ENCOUNTER — Telehealth: Payer: Self-pay | Admitting: Obstetrics & Gynecology

## 2017-12-26 NOTE — Telephone Encounter (Signed)
Called pt to let her know [facility] is not in the office , Pt wants to know is there is another medication ? Pt needs a note for Saturday, not making it to work for OAB. Pt states she has looked up online there is plenty of medications for OAB. She needs one for the daytime as well as the one for night, she's going every 5 to 10 min.

## 2017-12-26 NOTE — Telephone Encounter (Signed)
PT CAME IN EXPLAINING THE SAME ISSUES ARE HAPPENING AND IS WONDERING IF THERE IS A PILL SHE CAN TAKE FOR DURING THE DAY AS WELL BECAUSE SHE CAN'T GO TO WORK WITH THIS ISSUE STILL OCCURRING. PT WILL ALSO NEED WORK NOTE BECAUSE SHE CANNOT WORK WITH THIS STILL HAPPENING. PLEASE ADVISE.

## 2017-12-27 ENCOUNTER — Other Ambulatory Visit: Payer: Self-pay

## 2017-12-27 ENCOUNTER — Telehealth: Payer: Self-pay

## 2017-12-27 DIAGNOSIS — Z1211 Encounter for screening for malignant neoplasm of colon: Secondary | ICD-10-CM

## 2017-12-27 NOTE — Telephone Encounter (Signed)
Gastroenterology Pre-Procedure Review  Request Date: 01/26/18 Requesting Physician: Dr. Allegra LaiVanga  PATIENT REVIEW QUESTIONS: The patient responded to the following health history questions as indicated:    1. Are you having any GI issues? no 2. Do you have a personal history of Polyps? yes (Unsure of year ?2014) 3. Do you have a family history of Colon Cancer or Polyps? no 4. Diabetes Mellitus? yes (oral meds) 5. Joint replacements in the past 12 months?no 6. Major health problems in the past 3 months?yes ([facility]) 7. Any artificial heart valves, MVP, or defibrillator?no    MEDICATIONS & ALLERGIES:    Patient reports the following regarding taking any anticoagulation/antiplatelet therapy:   Plavix, Coumadin, Eliquis, Xarelto, Lovenox, Pradaxa, Brilinta, or Effient? no Aspirin? no  Patient confirms/reports the following medications:  Current Outpatient Medications  Medication Sig Dispense Refill  . abacavir-dolutegravir-lamiVUDine (TRIUMEQ) 600-50-300 MG tablet Take 1 tablet by mouth every morning. 90 tablet 1  . ciprofloxacin (CIPRO) 500 MG tablet Take 1 tablet (500 mg total) by mouth 2 (two) times daily. (Patient not taking: Reported on 12/15/2017) 14 tablet 0  . Lancets 30G MISC Blood sugar testing done QAM prior to meals - dx E11.65 100 each 4  . levothyroxine (SYNTHROID, LEVOTHROID) 50 MCG tablet Take 50 mcg by mouth daily before breakfast.    . losartan-hydrochlorothiazide (HYZAAR) 100-12.5 MG tablet Take 1 tablet by mouth daily. 90 tablet 1  . metFORMIN (GLUCOPHAGE) 500 MG tablet TAKE 1/2 TABLET BY MOUTH TWICE A DAY 30 tablet 2  . metoprolol tartrate (LOPRESSOR) 50 MG tablet Take 50 mg by mouth 2 (two) times daily.    Marland Kitchen. omeprazole (PRILOSEC) 20 MG capsule TAKE 2 CAPSULES TWICE A DAY 60 capsule 2  . oxybutynin (DITROPAN XL) 15 MG 24 hr tablet Take 1 tablet (15 mg total) by mouth at bedtime. 30 tablet 11  . oxyCODONE-acetaminophen (PERCOCET) 5-325 MG tablet Take 1 tablet  by mouth every 4 (four) hours as needed for moderate pain or severe pain. (Patient not taking: Reported on 12/15/2017) 30 tablet 0  . rosuvastatin (CRESTOR) 5 MG tablet Take 5 mg by mouth at bedtime.     . Vitamin D, Ergocalciferol, (DRISDOL) 50000 units CAPS capsule TAKE 1 CAPSULE BY MOUTH ONCE WEEKLY 12 capsule 0   No current facility-administered medications for this visit.     Patient confirms/reports the following allergies:  Allergies  Allergen Reactions  . Sulfa Antibiotics Other (See Comments)    Unknown    No orders of the defined types were placed in this encounter.   AUTHORIZATION INFORMATION Primary Insurance: 1D#: Group #:  Secondary Insurance: 1D#: Group #:  SCHEDULE INFORMATION: Date: 01/26/18 Time: Location:[facility]

## 2017-12-29 ENCOUNTER — Ambulatory Visit: Payer: Medicare Other | Admitting: Obstetrics & Gynecology

## 2017-12-29 MED ORDER — SOLIFENACIN SUCCINATE 10 MG PO TABS: 10.0000 mg | ORAL_TABLET | Freq: Every day | ORAL | 6 refills | Status: DC

## 2017-12-29 NOTE — Telephone Encounter (Signed)
Pt aware.

## 2017-12-29 NOTE — Telephone Encounter (Signed)
lmtrc

## 2017-12-29 NOTE — Telephone Encounter (Signed)
1. Pt needs to see Urology for consultation as this has persisted beyond 6 weeks from surgery.  We have discussed this before, but I dont think she has gone/scheduled. 2. The medicine she is on is for 24 hours, so she does not need a daytime vs nighttime medicine.  Now, it may not be working and we can try something else.  I will change her Rx to replace what she is on.

## 2018-01-02 ENCOUNTER — Encounter: Payer: Self-pay | Admitting: Nurse Practitioner

## 2018-01-02 ENCOUNTER — Ambulatory Visit (INDEPENDENT_AMBULATORY_CARE_PROVIDER_SITE_OTHER): Payer: Medicare Other | Admitting: Nurse Practitioner

## 2018-01-02 VITALS — BP 121/72 | HR 67 | Resp 16 | Ht 67.0 in | Wt 246.2 lb

## 2018-01-02 DIAGNOSIS — B351 Tinea unguium: Secondary | ICD-10-CM | POA: Diagnosis not present

## 2018-01-02 DIAGNOSIS — I10 Essential (primary) hypertension: Secondary | ICD-10-CM

## 2018-01-02 DIAGNOSIS — B20 Human immunodeficiency virus [HIV] disease: Secondary | ICD-10-CM | POA: Diagnosis not present

## 2018-01-02 DIAGNOSIS — E11649 Type 2 diabetes mellitus with hypoglycemia without coma: Secondary | ICD-10-CM

## 2018-01-02 LAB — POCT GLYCOSYLATED HEMOGLOBIN (HGB A1C): Hemoglobin A1C: 5.7

## 2018-01-02 MED ORDER — METFORMIN HCL 500 MG PO TABS: 250.0000 mg | ORAL_TABLET | Freq: Two times a day (BID) | ORAL | 2 refills | Status: DC

## 2018-01-02 NOTE — Progress Notes (Signed)
[facility] [address] [place], [place] [address]  Internal MEDICINE  Office Visit Note  Patient Name: Misty Larsen  [id number]  [id number]  Date of Service: 01/02/2018   Pt is here for a sick visit.  Chief Complaint  Patient presents with  . Foot Problem  . Diabetes     The patient is here for sick visit. She states that she has fungal infection and thick toenails on both big toes. Was recommended she see Dr. Al CorpusHyatt, podiatry. This recommendation was made per hr GYN provider. She states that she also has arthritis in both feet.        Current Medication:  Outpatient Encounter Medications as of 01/02/2018  Medication Sig  . abacavir-dolutegravir-lamiVUDine (TRIUMEQ) 600-50-300 MG tablet Take 1 tablet by mouth every morning.  . ciprofloxacin (CIPRO) 500 MG tablet Take 1 tablet (500 mg total) by mouth 2 (two) times daily.  . Lancets 30G MISC Blood sugar testing done QAM prior to meals - dx E11.65  . levothyroxine (SYNTHROID, LEVOTHROID) 50 MCG tablet Take 50 mcg by mouth daily before breakfast.  . losartan-hydrochlorothiazide (HYZAAR) 100-12.5 MG tablet Take 1 tablet by mouth daily.  . metFORMIN (GLUCOPHAGE) 500 MG tablet TAKE 1/2 TABLET BY MOUTH TWICE A DAY  . metoprolol tartrate (LOPRESSOR) 50 MG tablet Take 50 mg by mouth 2 (two) times daily.  Marland Kitchen. omeprazole (PRILOSEC) 20 MG capsule TAKE 2 CAPSULES TWICE A DAY  . oxyCODONE-acetaminophen (PERCOCET) 5-325 MG tablet Take 1 tablet by mouth every 4 (four) hours as needed for moderate pain or severe pain.  . rosuvastatin (CRESTOR) 5 MG tablet Take 5 mg by mouth at bedtime.   . solifenacin (VESICARE) 10 MG tablet Take 1 tablet (10 mg total) by mouth daily.  . Vitamin D, Ergocalciferol, (DRISDOL) 50000 units CAPS capsule TAKE 1 CAPSULE BY MOUTH ONCE WEEKLY   No facility-administered encounter medications on file as of 01/02/2018.       Medical History: Past Medical History:  Diagnosis Date  . Arthritis    FEET   . Diabetes mellitus without complication (HCC)   . GERD (gastroesophageal reflux disease)   . History of hiatal hernia    SMALL PER PT  . HIV (human immunodeficiency virus infection) (HCC)   . Hypertension   . Hypothyroidism   . Overactive bladder   . Pneumonia 2007   Today's Vitals   01/02/18 1508  BP: 121/72  Pulse: 67  Resp: 16  SpO2: 100%  Weight: 246 lb 3.2 oz (111.7 kg)  Height: 5\' 7"  (1.702 m)    Review of Systems  Constitutional: Negative for activity change, chills, fatigue and unexpected weight change.  HENT: Negative for congestion, postnasal drip, rhinorrhea, sneezing, sore throat and voice change.   Eyes: Negative.  Negative for redness.  Respiratory: Negative for cough, chest tightness, shortness of breath and wheezing.   Cardiovascular: Negative for chest pain and palpitations.  Gastrointestinal: Negative for abdominal pain, constipation, diarrhea, nausea and vomiting.  Endocrine:       Blood sugars doing well   Genitourinary: Negative for dysuria and frequency.  Musculoskeletal: Negative for arthralgias, back pain, joint swelling and neck pain.       Pain in bones of both feet.   Skin: Negative for rash.       Thick and ingrowing toenails, both feet.   Allergic/Immunologic: Positive for environmental allergies.  Neurological: Negative for dizziness, tremors, weakness, numbness and headaches.  Hematological: Negative for adenopathy. Does not bruise/bleed easily.  Psychiatric/Behavioral:  Negative for behavioral problems (Depression), sleep disturbance and suicidal ideas. The patient is not nervous/anxious.     Physical Exam  Constitutional: She is oriented to person, place, and time. She appears well-developed and well-nourished. No distress.  HENT:  Head: Normocephalic and atraumatic.  Mouth/Throat: Oropharynx is clear and moist. No oropharyngeal exudate.  Eyes: Pupils are equal, round, and reactive to light. EOM are normal.  Neck: Normal range of  motion. Neck supple. No JVD present. No tracheal deviation present. No thyromegaly present.  Cardiovascular: Normal rate, regular rhythm and normal heart sounds. Exam reveals no gallop and no friction rub.  No murmur heard. Pulmonary/Chest: Effort normal. No respiratory distress. She has no wheezes. She has no rales. She exhibits no tenderness.  Abdominal: Soft. Bowel sounds are normal. There is no tenderness.  Musculoskeletal: Normal range of motion.  Lymphadenopathy:    She has no cervical adenopathy.  Neurological: She is alert and oriented to person, place, and time. No cranial nerve deficit.  Skin: Skin is warm and dry. She is not diaphoretic.  Thickened and yellow colored toenails on both feet, especially the great toenails.   Psychiatric: She has a normal mood and affect. Her behavior is normal. Judgment and thought content normal.  Nursing note and vitals reviewed.   Assessment/Plan: 1. Uncontrolled type 2 diabetes mellitus with hypoglycemia, unspecified hypoglycemia coma status (HCC) - POCT HgB A1C 5.7 today. Continue all diabetic medications as prescribed  - metFORMIN (GLUCOPHAGE) 500 MG tablet; Take 0.5 tablets (250 mg total) by mouth 2 (two) times daily.  Dispense: 30 tablet; Refill: 2  2. Tinea of nail - Ambulatory referral to Podiatry (Dr. Al Corpus) per GYN request.   3. Essential hypertension Stable. Continue bp medication as prescrribed   4. HIV infection, unspecified symptom status (HCC) Continue anti-viral medication as prescribed. Needs to make appointment with ID provider for continued management.   General Counseling: shanicqua coldren understanding of the findings of todays visit and agrees with plan of treatment. I have discussed any further diagnostic evaluation that may be needed or ordered today. We also reviewed her medications today. she has been encouraged to call the office with any questions or concerns that should arise related to todays visit.  This  patient was seen by Vincent Gros, FNP- C in Collaboration with Dr Lyndon Code as a part of collaborative care agreement    Orders Placed This Encounter  Procedures  . Ambulatory referral to Podiatry  . POCT HgB A1C      Time spent: 15 Minutes

## 2018-01-13 ENCOUNTER — Ambulatory Visit (INDEPENDENT_AMBULATORY_CARE_PROVIDER_SITE_OTHER): Payer: Medicare Other | Admitting: Podiatry

## 2018-01-13 ENCOUNTER — Encounter: Payer: Self-pay | Admitting: Podiatry

## 2018-01-13 DIAGNOSIS — M79676 Pain in unspecified toe(s): Secondary | ICD-10-CM | POA: Diagnosis not present

## 2018-01-13 DIAGNOSIS — E0843 Diabetes mellitus due to underlying condition with diabetic autonomic (poly)neuropathy: Secondary | ICD-10-CM

## 2018-01-13 DIAGNOSIS — B351 Tinea unguium: Secondary | ICD-10-CM

## 2018-01-13 DIAGNOSIS — Z79899 Other long term (current) drug therapy: Secondary | ICD-10-CM

## 2018-01-13 MED ORDER — TERBINAFINE HCL 250 MG PO TABS: 250.0000 mg | ORAL_TABLET | Freq: Every day | ORAL | 0 refills | Status: DC

## 2018-01-13 NOTE — Progress Notes (Signed)
   SUBJECTIVE Patient with a history of diabetes mellitus presents to office today complaining of elongated, thickened nails that cause pain while ambulating in shoes. She reports fungus to all nails bilaterally. She has been using OTC antifungal creams with no significant relief. She is unable to trim her own nails. Patient is here for further evaluation and treatment.   Past Medical History:  Diagnosis Date  . Arthritis    FEET  . Diabetes mellitus without complication (HCC)   . GERD (gastroesophageal reflux disease)   . History of hiatal hernia    SMALL PER PT  . HIV (human immunodeficiency virus infection) (HCC)   . Hypertension   . Hypothyroidism   . Overactive bladder   . Pneumonia 2007    OBJECTIVE General Patient is awake, alert, and oriented x 3 and in no acute distress. Derm Skin is dry and supple bilateral. Negative open lesions or macerations. Remaining integument unremarkable. Nails are tender, long, thickened and dystrophic with subungual debris, consistent with onychomycosis, 1-5 bilateral. No signs of infection noted. Vasc  DP and PT pedal pulses palpable bilaterally. Temperature gradient within normal limits.  Neuro Epicritic and protective threshold sensation diminished bilaterally.  Musculoskeletal Exam No symptomatic pedal deformities noted bilateral. Muscular strength within normal limits.  ASSESSMENT 1. Diabetes Mellitus w/ peripheral neuropathy 2. Onychomycosis of nail due to dermatophyte bilateral 3. Pain in foot bilateral  PLAN OF CARE 1. Patient evaluated today. 2. Instructed to maintain good pedal hygiene and foot care. Stressed importance of controlling blood sugar.  3. Mechanical debridement of nails 1-5 bilaterally performed using a nail nipper. Filed with dremel without incident.  4. Prescription for Lamisil 250 mg #90 provided to patient.  5. Orders for liver function test placed today.  6. Appointment with Shanda BumpsJessica, RN for fungal laser treatment.    7. Return to clinic as needed.     Felecia ShellingBrent M. Evans, DPM [facility]  Dr. Felecia ShellingBrent M. Evans, DPM    [address]                                        [place], [place] [address]                Office [phone]  Fax [phone]

## 2018-01-19 ENCOUNTER — Telehealth: Payer: Self-pay | Admitting: Gastroenterology

## 2018-01-19 ENCOUNTER — Ambulatory Visit: Payer: Self-pay | Admitting: Urology

## 2018-01-19 NOTE — Telephone Encounter (Signed)
Pt left vm to speak to someone about the colonoscopy she is having 04/29

## 2018-01-20 ENCOUNTER — Telehealth: Payer: Self-pay | Admitting: Gastroenterology

## 2018-01-20 NOTE — Telephone Encounter (Signed)
Pt is calling she has questions regarding  Her procedure she is taking several rx she has to take and she also needs to know what time  Her procedure is because she needs to schedule a ride she also said she will not be able to bring anyone with her. Please call pt procedure is 04/25

## 2018-01-20 NOTE — Telephone Encounter (Signed)
Canceled due to transportation

## 2018-01-20 NOTE — Telephone Encounter (Signed)
Colonoscopy will be canceled due to transportation.  She will call back at another time when she has transportation that can stay with her through out the colonoscopy.

## 2018-01-25 DIAGNOSIS — E11649 Type 2 diabetes mellitus with hypoglycemia without coma: Secondary | ICD-10-CM | POA: Insufficient documentation

## 2018-01-25 DIAGNOSIS — B351 Tinea unguium: Secondary | ICD-10-CM | POA: Insufficient documentation

## 2018-01-26 ENCOUNTER — Ambulatory Visit: Admission: RE | Admit: 2018-01-26 | Payer: Medicare Other | Source: Ambulatory Visit | Admitting: Gastroenterology

## 2018-01-26 ENCOUNTER — Encounter: Admission: RE | Payer: Self-pay | Source: Ambulatory Visit

## 2018-01-26 SURGERY — COLONOSCOPY WITH PROPOFOL
Anesthesia: General

## 2018-01-28 DIAGNOSIS — Z79899 Other long term (current) drug therapy: Secondary | ICD-10-CM | POA: Diagnosis not present

## 2018-01-29 LAB — HEPATIC FUNCTION PANEL
ALT: 19 IU/L (ref 0–32)
AST: 14 IU/L (ref 0–40)
Albumin: 4.3 g/dL (ref 3.5–5.5)
Alkaline Phosphatase: 66 IU/L (ref 39–117)
Bilirubin Total: 0.4 mg/dL (ref 0.0–1.2)
Bilirubin, Direct: 0.1 mg/dL (ref 0.00–0.40)
Total Protein: 8.4 g/dL (ref 6.0–8.5)

## 2018-01-30 ENCOUNTER — Ambulatory Visit: Payer: Medicare Other

## 2018-01-30 DIAGNOSIS — M79676 Pain in unspecified toe(s): Principal | ICD-10-CM

## 2018-01-30 DIAGNOSIS — B351 Tinea unguium: Secondary | ICD-10-CM

## 2018-02-16 ENCOUNTER — Ambulatory Visit: Payer: Self-pay | Admitting: Nurse Practitioner

## 2018-02-17 NOTE — Progress Notes (Signed)
Pt presents with mycotic infection of nails 1-5 bilateral  All other systems are negative  Laser therapy administered to affected nails and tolerated well. All safety precautions were in place. RE-appointed in 4 weeks for 2nd treatment 

## 2018-02-18 ENCOUNTER — Telehealth: Payer: Self-pay

## 2018-02-18 NOTE — Telephone Encounter (Signed)
Mailed Discharge letter to patient, due to non compliant with physician orders, she will need to find new pcp. Beth

## 2018-03-02 ENCOUNTER — Ambulatory Visit (INDEPENDENT_AMBULATORY_CARE_PROVIDER_SITE_OTHER): Payer: Medicare Other

## 2018-03-02 DIAGNOSIS — E0843 Diabetes mellitus due to underlying condition with diabetic autonomic (poly)neuropathy: Secondary | ICD-10-CM

## 2018-03-02 DIAGNOSIS — Z79899 Other long term (current) drug therapy: Secondary | ICD-10-CM

## 2018-03-02 DIAGNOSIS — B351 Tinea unguium: Secondary | ICD-10-CM

## 2018-03-02 DIAGNOSIS — M79676 Pain in unspecified toe(s): Secondary | ICD-10-CM

## 2018-03-02 MED ORDER — NONFORMULARY OR COMPOUNDED ITEM: 1.0000 g | Freq: Every day | 11 refills | Status: DC

## 2018-03-02 NOTE — Progress Notes (Signed)
Patient could not tolerate laser procedure, her nails are too sensitive and it causes too much pain.   She is to continue taking the lamisil as directed, [facility] for a topical for her to use. No charge for the laser today, no future laser treatments at this time

## 2018-03-14 ENCOUNTER — Other Ambulatory Visit: Payer: Self-pay | Admitting: Internal Medicine

## 2018-03-16 ENCOUNTER — Other Ambulatory Visit: Payer: Self-pay

## 2018-03-18 ENCOUNTER — Other Ambulatory Visit: Payer: Self-pay

## 2018-03-19 ENCOUNTER — Other Ambulatory Visit: Payer: Self-pay

## 2018-03-19 MED ORDER — OMEPRAZOLE 20 MG PO CPDR
40.0000 mg | DELAYED_RELEASE_CAPSULE | Freq: Two times a day (BID) | ORAL | 2 refills | Status: DC
Start: 2018-03-19 — End: 2020-03-07

## 2018-03-25 DIAGNOSIS — K635 Polyp of colon: Secondary | ICD-10-CM | POA: Insufficient documentation

## 2018-03-25 DIAGNOSIS — Z1211 Encounter for screening for malignant neoplasm of colon: Secondary | ICD-10-CM | POA: Diagnosis not present

## 2018-03-25 DIAGNOSIS — K219 Gastro-esophageal reflux disease without esophagitis: Secondary | ICD-10-CM | POA: Diagnosis not present

## 2018-03-25 DIAGNOSIS — E119 Type 2 diabetes mellitus without complications: Secondary | ICD-10-CM | POA: Diagnosis not present

## 2018-03-25 DIAGNOSIS — Z87891 Personal history of nicotine dependence: Secondary | ICD-10-CM | POA: Diagnosis not present

## 2018-03-25 DIAGNOSIS — I10 Essential (primary) hypertension: Secondary | ICD-10-CM | POA: Diagnosis not present

## 2018-03-25 DIAGNOSIS — Z13220 Encounter for screening for lipoid disorders: Secondary | ICD-10-CM | POA: Diagnosis not present

## 2018-03-25 DIAGNOSIS — E039 Hypothyroidism, unspecified: Secondary | ICD-10-CM | POA: Diagnosis not present

## 2018-04-06 ENCOUNTER — Encounter: Payer: Self-pay | Admitting: Obstetrics & Gynecology

## 2018-04-06 ENCOUNTER — Ambulatory Visit (INDEPENDENT_AMBULATORY_CARE_PROVIDER_SITE_OTHER): Payer: Medicare Other | Admitting: Obstetrics & Gynecology

## 2018-04-06 VITALS — BP 120/80 | Ht 67.0 in | Wt 248.0 lb

## 2018-04-06 DIAGNOSIS — N898 Other specified noninflammatory disorders of vagina: Secondary | ICD-10-CM | POA: Diagnosis not present

## 2018-04-06 DIAGNOSIS — R300 Dysuria: Secondary | ICD-10-CM | POA: Diagnosis not present

## 2018-04-06 LAB — POCT URINALYSIS DIPSTICK
APPEARANCE: NORMAL
Bilirubin, UA: NEGATIVE
GLUCOSE UA: NEGATIVE
Ketones, UA: NEGATIVE
LEUKOCYTES UA: NEGATIVE
Nitrite, UA: NEGATIVE
Protein, UA: NEGATIVE
RBC UA: NEGATIVE
Spec Grav, UA: 1.01 (ref 1.010–1.025)
Urobilinogen, UA: 1 E.U./dL
pH, UA: 5 (ref 5.0–8.0)

## 2018-04-06 MED ORDER — OSPEMIFENE 60 MG PO TABS: 1.0000 | ORAL_TABLET | Freq: Every day | ORAL | 11 refills | Status: DC

## 2018-04-06 NOTE — Progress Notes (Signed)
  HPI:      [name] is a 52 y.o. G0P0000 who is postmenopausal, presents today for a problem visit.  She complains of vaginal dryness.   Symptoms have been present for a few weeks. Symptoms are mod to severe and has led her to come in today to seek options for intervention.  Previous Treatment: Replens, had side effects, did not help  She is has sex with females. Denies PostMenopausal Bleeding  PMHx: She  has a past medical history of Arthritis, Diabetes mellitus without complication (HCC), GERD (gastroesophageal reflux disease), History of hiatal hernia, HIV (human immunodeficiency virus infection) (HCC), Hypertension, Hypothyroidism, Overactive bladder, and Pneumonia (2007). Also,  has a past surgical history that includes Cryotherapy (2013-2014); Colonoscopy (2014); Joint replacement (Left, 2011); Breast surgery; Carpal tunnel release (2012); Cervix surgery (2013); Biopsy thyroid; Laparoscopic hysterectomy (Bilateral, 10/30/2017); and Cystoscopy (10/30/2017)., family history includes Breast cancer in her mother.,  reports that she quit smoking about 15 months ago. Her smoking use included cigarettes. She started smoking about 42 years ago. She has a 20.50 pack-year smoking history. She has never used smokeless tobacco. She reports that she drinks alcohol. She reports that she does not use drugs.  She has a current medication list which includes the following prescription(s): abacavir-dolutegravir-lamivudine, lancets 30g, levothyroxine, losartan-hydrochlorothiazide, metformin, metoprolol tartrate, NONFORMULARY OR COMPOUNDED ITEM, omeprazole, rosuvastatin, terbinafine, vitamin d (ergocalciferol), ospemifene, and solifenacin. Also, is allergic to sulfa antibiotics.  Review of Systems  Constitutional: Negative for chills, fever and malaise/fatigue.  HENT: Negative for congestion, sinus pain and sore throat.   Eyes: Negative for blurred vision and pain.  Respiratory: Negative for cough and  wheezing.   Cardiovascular: Negative for chest pain and leg swelling.  Gastrointestinal: Negative for abdominal pain, constipation, diarrhea, heartburn, nausea and vomiting.  Genitourinary: Negative for dysuria, frequency, hematuria and urgency.  Musculoskeletal: Negative for back pain, joint pain, myalgias and neck pain.  Skin: Negative for itching and rash.  Neurological: Negative for dizziness, tremors and weakness.  Endo/Heme/Allergies: Does not bruise/bleed easily.  Psychiatric/Behavioral: Negative for depression. The patient is not nervous/anxious and does not have insomnia.   All other systems reviewed and are negative.   Objective: BP 120/80   Ht 5\' 7"  (1.702 m)   Wt 248 lb (112.5 kg)   LMP 07/05/2011 (Approximate) Comment: states "it's been about 9 years"  BMI 38.84 kg/m  Physical Exam  Constitutional: She is oriented to person, place, and time. She appears well-developed and well-nourished. No distress.  Musculoskeletal: Normal range of motion.  Neurological: She is alert and oriented to person, place, and time.  Skin: Skin is warm and dry.  Psychiatric: She has a normal mood and affect.  Vitals reviewed.   ASSESSMENT/PLAN:  Menopause.   Dysuria - POCT urinalysis dipstick - No sign of UTI  2. Vaginal dryness Options discussed Replens, no help and did not like liquid form/ side effects Prefers Intrarosa but not covered Will try Osphena, risks discussed F/U planned  A total of 15 minutes were spent face-to-face with the patient during this encounter and over half of that time dealt with counseling and coordination of care.   Annamarie MajorPaul Jewelene Mairena, MD, Merlinda FrederickFACOG [facility], [facility] 04/06/2018  3:34 PM

## 2018-04-06 NOTE — Telephone Encounter (Signed)
This encounter was created in error - please disregard.

## 2018-04-06 NOTE — Patient Instructions (Signed)
Ospemifene oral tablets What is this medicine? OSPEMIFENE (os PEM i feen) is used to treat painful sexual intercourse in females after menopause, a symptom of menopause that occurs due to changes in and around the vagina. This medicine may be used for other purposes; ask your health care provider or pharmacist if you have questions. COMMON BRAND NAME(S): Osphena What should I tell my health care provider before I take this medicine? They need to know if you have any of these conditions: -cancer, such as breast, uterine, or other cancer -heart disease -history of blood clots -history of stroke -history of vaginal bleeding -liver disease -premenopausal -smoke tobacco -an unusual or allergic reaction to ospemifene, other medicines, foods, dyes, or preservatives -pregnant or trying to get pregnant -breast-feeding How should I use this medicine? Take this medicine by mouth with a glass of water. Take this medicine with food. Follow the directions on the prescription label. Do not take your medicine more often than directed. Talk to your pediatrician regarding the use of this medicine in children. Special care may be needed. Overdosage: If you think you have taken too much of this medicine contact a poison control center or emergency room at once. NOTE: This medicine is only for you. Do not share this medicine with others. What if I miss a dose? If you miss a dose, take it as soon as you can. If it is almost time for your next dose, take only that dose. Do not take double or extra doses. What may interact with this medicine? -doxycycline -estrogens -fluconazole -furosemide -glyburide -ketoconazole -phenytoin -rifampin -warfarin This list may not describe all possible interactions. Give your health care provider a list of all the medicines, herbs, non-prescription drugs, or dietary supplements you use. Also tell them if you smoke, drink alcohol, or use illegal drugs. Some items may  interact with your medicine. What should I watch for while using this medicine? Visit your health care professional for regular checks on your progress. You will need a regular breast and pelvic exam and Pap smear while on this medicine. You should also discuss the need for regular mammograms with your health care professional, and follow his or her guidelines for these tests. Also, periodically discuss the need to continue taking this medicine. Taking this medicine for long periods of time may increase your risk for serious side effects. This medicine can increase the risk of developing a condition (endometrial hyperplasia) that may lead to cancer of the lining of the uterus. Taking progestins, another hormone drug, with this medicine lowers the risk of developing this condition. Therefore, if your uterus has not been removed (by a hysterectomy), your doctor may prescribe a progestin for you to take together with your estrogen. You should know, however, that taking estrogens with progestins may have additional health risks. You should discuss the use of estrogens and progestins with your health care professional to determine the benefits and risks for you. This medicine can rarely cause blood clots. You should avoid long periods of bed rest while taking this medicine. If you are going to have surgery, tell your doctor or health care professional that you are taking this medicine. This medicine should be stopped at least 4-6 weeks before surgery. After surgery, it should be restarted only after you are walking again. It should not be restarted while you still need long periods of bed rest. You should not smoke while taking this medicine. Smoking may also increase your risk of blood clots. Smoking can also   decrease the effects of this medicine. This medicine does not prevent hot flashes. It may cause hot flashes in some patients. If you have any reason to think you are pregnant; stop taking this medicine at  once and contact your doctor or health care professional. What side effects may I notice from receiving this medicine? Side effects that you should report to your doctor or health care professional as soon as possible: -breathing problems -changes in vision -confusion, trouble speaking or understanding -new breast lumps -pain, swelling, warmth in the leg -pelvic pain or pressure -severe headaches -sudden chest pain -sudden numbness or weakness of the face, arm or leg -trouble walking, dizziness, loss of balance or coordination -unusual vaginal bleeding patterns -vaginal discharge that is bloody or brown Side effects that usually do not require medical attention (report to your doctor or health care professional if they continue or are bothersome): -hot flushes or flashes -increased sweating -muscle cramps -vaginal discharge (white or clear) This list may not describe all possible side effects. Call your doctor for medical advice about side effects. You may report side effects to FDA at 1-800-FDA-1088. Where should I keep my medicine? Keep out of the reach of children. Store at room temperature between 20 and 25 degrees C (68 and 77 degrees F). Protect from light. Keep container tightly closed. Throw away any unused medicine after the expiration date. NOTE: This sheet is a summary. It may not cover all possible information. If you have questions about this medicine, talk to your doctor, pharmacist, or health care provider.  2018 Elsevier/Gold Standard (2015-10-19 10:08:00)  

## 2018-04-12 ENCOUNTER — Other Ambulatory Visit: Payer: Self-pay | Admitting: Internal Medicine

## 2018-04-15 ENCOUNTER — Other Ambulatory Visit: Payer: Self-pay | Admitting: Internal Medicine

## 2018-04-17 ENCOUNTER — Other Ambulatory Visit: Payer: Self-pay

## 2018-04-17 MED ORDER — TERBINAFINE HCL 250 MG PO TABS: 250.0000 mg | ORAL_TABLET | Freq: Every day | ORAL | 0 refills | Status: DC

## 2018-04-17 NOTE — Telephone Encounter (Signed)
Pharmacy refill request for Lamisil   Per Dr. Logan BoresEvans verbal order, ok to refill this time only and patient needs follow up appt.   Script has been sent to pharmacy with patient instruction for return appt.

## 2018-04-19 ENCOUNTER — Other Ambulatory Visit: Payer: Self-pay | Admitting: Internal Medicine

## 2018-04-27 ENCOUNTER — Other Ambulatory Visit: Payer: Self-pay | Admitting: Internal Medicine

## 2018-04-28 ENCOUNTER — Other Ambulatory Visit: Payer: Self-pay

## 2018-04-28 NOTE — Patient Outreach (Signed)
[facility] Care Management  04/28/2018  Amelia JoGloria Mittal [birth year] [id number]   Medication Adherence call to Mrs. Amelia JoGloria Faith spoke with patient she ask if we can contact [facility] and order Rosuvastatin 5 mg. patient is seen a new primary doctor she ask if we can contact for more refill because the old doctor deny the refill, left a message for doctor Huntley Decomlin to send in a new [facility] to [facility]. Mrs. Sibyl ParrChapman is showing past due under [facility].  Lillia AbedAna Ollison-Moran CPhT Pharmacy Technician [facility] Care Management Direct Dial [phone]  Fax [phone] [email or web address]

## 2018-04-30 DIAGNOSIS — I10 Essential (primary) hypertension: Secondary | ICD-10-CM | POA: Diagnosis not present

## 2018-04-30 DIAGNOSIS — R200 Anesthesia of skin: Secondary | ICD-10-CM | POA: Diagnosis not present

## 2018-04-30 DIAGNOSIS — E7800 Pure hypercholesterolemia, unspecified: Secondary | ICD-10-CM | POA: Diagnosis not present

## 2018-04-30 DIAGNOSIS — R7989 Other specified abnormal findings of blood chemistry: Secondary | ICD-10-CM | POA: Diagnosis not present

## 2018-06-04 DIAGNOSIS — Z1231 Encounter for screening mammogram for malignant neoplasm of breast: Secondary | ICD-10-CM | POA: Diagnosis not present

## 2018-06-04 DIAGNOSIS — N6311 Unspecified lump in the right breast, upper outer quadrant: Secondary | ICD-10-CM | POA: Diagnosis not present

## 2018-06-04 DIAGNOSIS — N6489 Other specified disorders of breast: Secondary | ICD-10-CM | POA: Diagnosis not present

## 2018-06-10 ENCOUNTER — Other Ambulatory Visit: Payer: Self-pay

## 2018-06-10 DIAGNOSIS — G5611 Other lesions of median nerve, right upper limb: Secondary | ICD-10-CM | POA: Diagnosis not present

## 2018-06-10 DIAGNOSIS — G5603 Carpal tunnel syndrome, bilateral upper limbs: Secondary | ICD-10-CM | POA: Diagnosis not present

## 2018-06-10 DIAGNOSIS — G5601 Carpal tunnel syndrome, right upper limb: Secondary | ICD-10-CM | POA: Diagnosis not present

## 2018-06-10 NOTE — Patient Outreach (Signed)
[facility]) Care Management  06/10/2018  Layken Rosero 1966/08/31 [id number]   Medication Adherence call to Mrs. Rayhana Marden left a a message for patient to call back patient is due on Losartan/Hctz 100/25 and Rosuvastatin 5 mg. Mrs. Bushaw is showing past due under [facility] Ins.   Lillia Abed CPhT Pharmacy Technician [facility] Management Direct Dial [phone]  Fax [phone] [email or web address]

## 2018-06-11 DIAGNOSIS — Z79899 Other long term (current) drug therapy: Secondary | ICD-10-CM | POA: Diagnosis not present

## 2018-06-16 ENCOUNTER — Telehealth: Payer: Self-pay | Admitting: Podiatry

## 2018-06-16 NOTE — Telephone Encounter (Signed)
Pt is requesting a note for work. She needs to work only 5-6 hours a day due to Arthritis in her foot because she cant stand for a long period of time.

## 2018-06-19 ENCOUNTER — Encounter: Payer: Self-pay | Admitting: Podiatry

## 2018-06-19 ENCOUNTER — Telehealth: Payer: Self-pay | Admitting: Podiatry

## 2018-06-19 NOTE — Telephone Encounter (Signed)
Pt 2nd time calling in about a note for work. She needs to work only 5-6 hours a day due to Arthritis in her foot because she cant stand for a long period of time.

## 2018-06-24 DIAGNOSIS — I10 Essential (primary) hypertension: Secondary | ICD-10-CM | POA: Diagnosis not present

## 2018-06-24 DIAGNOSIS — Z1159 Encounter for screening for other viral diseases: Secondary | ICD-10-CM | POA: Diagnosis not present

## 2018-06-24 DIAGNOSIS — Z79899 Other long term (current) drug therapy: Secondary | ICD-10-CM | POA: Diagnosis not present

## 2018-06-24 DIAGNOSIS — G5603 Carpal tunnel syndrome, bilateral upper limbs: Secondary | ICD-10-CM | POA: Diagnosis not present

## 2018-06-24 DIAGNOSIS — H938X2 Other specified disorders of left ear: Secondary | ICD-10-CM | POA: Diagnosis not present

## 2018-06-24 DIAGNOSIS — H6121 Impacted cerumen, right ear: Secondary | ICD-10-CM | POA: Diagnosis not present

## 2018-07-03 ENCOUNTER — Encounter: Payer: Self-pay | Admitting: Podiatry

## 2018-07-03 ENCOUNTER — Ambulatory Visit (INDEPENDENT_AMBULATORY_CARE_PROVIDER_SITE_OTHER): Payer: Medicare Other | Admitting: Podiatry

## 2018-07-03 ENCOUNTER — Ambulatory Visit: Payer: Medicare Other

## 2018-07-03 DIAGNOSIS — B351 Tinea unguium: Secondary | ICD-10-CM

## 2018-07-03 DIAGNOSIS — M19079 Primary osteoarthritis, unspecified ankle and foot: Secondary | ICD-10-CM | POA: Diagnosis not present

## 2018-07-03 DIAGNOSIS — M79676 Pain in unspecified toe(s): Secondary | ICD-10-CM | POA: Diagnosis not present

## 2018-07-03 DIAGNOSIS — E0843 Diabetes mellitus due to underlying condition with diabetic autonomic (poly)neuropathy: Secondary | ICD-10-CM

## 2018-07-03 DIAGNOSIS — M79671 Pain in right foot: Secondary | ICD-10-CM

## 2018-07-03 DIAGNOSIS — M79672 Pain in left foot: Principal | ICD-10-CM

## 2018-07-03 MED ORDER — MELOXICAM 15 MG PO TABS: 15.0000 mg | ORAL_TABLET | Freq: Every day | ORAL | 1 refills | Status: AC

## 2018-07-06 DIAGNOSIS — G5602 Carpal tunnel syndrome, left upper limb: Secondary | ICD-10-CM | POA: Insufficient documentation

## 2018-07-06 DIAGNOSIS — G5601 Carpal tunnel syndrome, right upper limb: Secondary | ICD-10-CM | POA: Insufficient documentation

## 2018-07-10 DIAGNOSIS — M25431 Effusion, right wrist: Secondary | ICD-10-CM | POA: Diagnosis not present

## 2018-07-10 DIAGNOSIS — M25531 Pain in right wrist: Secondary | ICD-10-CM | POA: Diagnosis not present

## 2018-07-10 NOTE — Progress Notes (Signed)
   SUBJECTIVE Patient with a history of diabetes mellitus presents to office today complaining of elongated, thickened nails that cause pain while ambulating in shoes. She reports fungus to all nails bilaterally. She is unable to trim her own nails.  Patient also complains of bilateral foot pain with the right worse than the left.  She states that it hurts all over the foot.  Is paid painful to stand up after sitting for prolonged periods of time.  Standing on her feet causes pain.  She has been taking Tylenol arthritis which does alleviate some of her symptoms.   Past Medical History:  Diagnosis Date  . Arthritis    FEET  . Diabetes mellitus without complication (HCC)   . GERD (gastroesophageal reflux disease)   . History of hiatal hernia    SMALL PER PT  . HIV (human immunodeficiency virus infection) (HCC)   . Hypertension   . Hypothyroidism   . Overactive bladder   . Pneumonia 2007    OBJECTIVE General Patient is awake, alert, and oriented x 3 and in no acute distress. Derm Skin is dry and supple bilateral. Negative open lesions or macerations. Remaining integument unremarkable. Nails are tender, long, thickened and dystrophic with subungual debris, consistent with onychomycosis, 1-5 bilateral. No signs of infection noted. Vasc  DP and PT pedal pulses palpable bilaterally. Temperature gradient within normal limits.  Neuro Epicritic and protective threshold sensation diminished bilaterally.  Musculoskeletal Exam No symptomatic pedal deformities noted bilateral. Muscular strength within normal limits. Radiographic exam pes planus noted with collapse of the calcaneal inclination angle as well as metatarsal declination angle.  Generalized joint space narrowing noted throughout the pedal structures and joints of the foot bilateral.  No fracture identified.  Normal osseous mineralization noted.  ASSESSMENT 1. Diabetes Mellitus w/ peripheral neuropathy 2. Onychomycosis of nail due to  dermatophyte bilateral 3.  Pes planus bilateral with DJD  PLAN OF CARE 1. Patient evaluated today. 2. Instructed to maintain good pedal hygiene and foot care. Stressed importance of controlling blood sugar.  3. Mechanical debridement of nails 1-5 bilaterally performed using a nail nipper. Filed with dremel without incident.  4.  Recommend over-the-counter insoles with arch supports 5.  Patient declined anti-inflammatory injections today.  Prescription for meloxicam 50 mg daily 6.  Return to clinic in 3 months   Felecia Shelling, DPM [facility]  Dr. Felecia Shelling, DPM    [address]                                        [place], [place] [address]                Office [phone]  Fax [phone]

## 2018-07-24 DIAGNOSIS — H9012 Conductive hearing loss, unilateral, left ear, with unrestricted hearing on the contralateral side: Secondary | ICD-10-CM | POA: Insufficient documentation

## 2018-10-02 ENCOUNTER — Encounter: Payer: Medicare Other | Admitting: Podiatry

## 2018-10-06 NOTE — Progress Notes (Signed)
This encounter was created in error - please disregard.

## 2019-01-27 DIAGNOSIS — K219 Gastro-esophageal reflux disease without esophagitis: Secondary | ICD-10-CM | POA: Insufficient documentation

## 2019-02-05 ENCOUNTER — Telehealth: Payer: Self-pay | Admitting: Obstetrics and Gynecology

## 2019-02-05 NOTE — Telephone Encounter (Signed)
Feels like she has UTI and needs to come in for urine check.  Saw Dr. Tiburcio Pea for dysuria with neg UA 7/19.

## 2019-02-05 NOTE — Telephone Encounter (Signed)
Please schedule

## 2019-02-10 ENCOUNTER — Ambulatory Visit (INDEPENDENT_AMBULATORY_CARE_PROVIDER_SITE_OTHER): Payer: Medicare Other | Admitting: Advanced Practice Midwife

## 2019-02-10 ENCOUNTER — Other Ambulatory Visit: Payer: Self-pay

## 2019-02-10 VITALS — BP 134/84 | Ht 67.0 in | Wt 277.0 lb

## 2019-02-10 DIAGNOSIS — R300 Dysuria: Secondary | ICD-10-CM

## 2019-02-10 LAB — POCT URINALYSIS DIPSTICK
Bilirubin, UA: NEGATIVE
Blood, UA: NEGATIVE
Glucose, UA: NEGATIVE
Ketones, UA: NEGATIVE
Leukocytes, UA: NEGATIVE
Nitrite, UA: NEGATIVE
Protein, UA: NEGATIVE
Spec Grav, UA: 1.02 (ref 1.010–1.025)
Urobilinogen, UA: NEGATIVE E.U./dL — AB
pH, UA: 7 (ref 5.0–8.0)

## 2019-02-12 ENCOUNTER — Encounter: Payer: Self-pay | Admitting: Advanced Practice Midwife

## 2019-02-12 LAB — URINE CULTURE

## 2019-02-12 NOTE — Patient Instructions (Signed)
Health Maintenance for Postmenopausal Women Menopause is a normal process in which your reproductive ability comes to an end. This process happens gradually over a span of months to years, usually between the ages of 62 and 89. Menopause is complete when you have missed 12 consecutive menstrual periods. It is important to talk with your health care provider about some of the most common conditions that affect postmenopausal women, such as heart disease, cancer, and bone loss (osteoporosis). Adopting a healthy lifestyle and getting preventive care can help to promote your health and wellness. Those actions can also lower your chances of developing some of these common conditions. What should I know about menopause? During menopause, you may experience a number of symptoms, such as:  Moderate-to-severe hot flashes.  Night sweats.  Decrease in sex drive.  Mood swings.  Headaches.  Tiredness.  Irritability.  Memory problems.  Insomnia. Choosing to treat or not to treat menopausal changes is an individual decision that you make with your health care provider. What should I know about hormone replacement therapy and supplements? Hormone therapy products are effective for treating symptoms that are associated with menopause, such as hot flashes and night sweats. Hormone replacement carries certain risks, especially as you become older. If you are thinking about using estrogen or estrogen with progestin treatments, discuss the benefits and risks with your health care provider. What should I know about heart disease and stroke? Heart disease, heart attack, and stroke become more likely as you age. This may be due, in part, to the hormonal changes that your body experiences during menopause. These can affect how your body processes dietary fats, triglycerides, and cholesterol. Heart attack and stroke are both medical emergencies. There are many things that you can do to help prevent heart disease  and stroke:  Have your blood pressure checked at least every 1-2 years. High blood pressure causes heart disease and increases the risk of stroke.  If you are 79-72 years old, ask your health care provider if you should take aspirin to prevent a heart attack or a stroke.  Do not use any tobacco products, including cigarettes, chewing tobacco, or electronic cigarettes. If you need help quitting, ask your health care provider.  It is important to eat a healthy diet and maintain a healthy weight. ? Be sure to include plenty of vegetables, fruits, low-fat dairy products, and lean protein. ? Avoid eating foods that are high in solid fats, added sugars, or salt (sodium).  Get regular exercise. This is one of the most important things that you can do for your health. ? Try to exercise for at least 150 minutes each week. The type of exercise that you do should increase your heart rate and make you sweat. This is known as moderate-intensity exercise. ? Try to do strengthening exercises at least twice each week. Do these in addition to the moderate-intensity exercise.  Know your numbers.Ask your health care provider to check your cholesterol and your blood glucose. Continue to have your blood tested as directed by your health care provider.  What should I know about cancer screening? There are several types of cancer. Take the following steps to reduce your risk and to catch any cancer development as early as possible. Breast Cancer  Practice breast self-awareness. ? This means understanding how your breasts normally appear and feel. ? It also means doing regular breast self-exams. Let your health care provider know about any changes, no matter how small.  If you are 40 or  older, have a clinician do a breast exam (clinical breast exam or CBE) every year. Depending on your age, family history, and medical history, it may be recommended that you also have a yearly breast X-ray (mammogram).  If you  have a family history of breast cancer, talk with your health care provider about genetic screening.  If you are at high risk for breast cancer, talk with your health care provider about having an MRI and a mammogram every year.  Breast cancer (BRCA) gene test is recommended for women who have family members with BRCA-related cancers. Results of the assessment will determine the need for genetic counseling and BRCA1 and for BRCA2 testing. BRCA-related cancers include these types: ? Breast. This occurs in males or females. ? Ovarian. ? Tubal. This may also be called fallopian tube cancer. ? Cancer of the abdominal or pelvic lining (peritoneal cancer). ? Prostate. ? Pancreatic. Cervical, Uterine, and Ovarian Cancer Your health care provider may recommend that you be screened regularly for cancer of the pelvic organs. These include your ovaries, uterus, and vagina. This screening involves a pelvic exam, which includes checking for microscopic changes to the surface of your cervix (Pap test).  For women ages 21-65, health care providers may recommend a pelvic exam and a Pap test every three years. For women ages 39-65, they may recommend the Pap test and pelvic exam, combined with testing for human papilloma virus (HPV), every five years. Some types of HPV increase your risk of cervical cancer. Testing for HPV may also be done on women of any age who have unclear Pap test results.  Other health care providers may not recommend any screening for nonpregnant women who are considered low risk for pelvic cancer and have no symptoms. Ask your health care provider if a screening pelvic exam is right for you.  If you have had past treatment for cervical cancer or a condition that could lead to cancer, you need Pap tests and screening for cancer for at least 20 years after your treatment. If Pap tests have been discontinued for you, your risk factors (such as having a new sexual partner) need to be reassessed  to determine if you should start having screenings again. Some women have medical problems that increase the chance of getting cervical cancer. In these cases, your health care provider may recommend that you have screening and Pap tests more often.  If you have a family history of uterine cancer or ovarian cancer, talk with your health care provider about genetic screening.  If you have vaginal bleeding after reaching menopause, tell your health care provider.  There are currently no reliable tests available to screen for ovarian cancer. Lung Cancer Lung cancer screening is recommended for adults 57-50 years old who are at high risk for lung cancer because of a history of smoking. A yearly low-dose CT scan of the lungs is recommended if you:  Currently smoke.  Have a history of at least 30 pack-years of smoking and you currently smoke or have quit within the past 15 years. A pack-year is smoking an average of one pack of cigarettes per day for one year. Yearly screening should:  Continue until it has been 15 years since you quit.  Stop if you develop a health problem that would prevent you from having lung cancer treatment. Colorectal Cancer  This type of cancer can be detected and can often be prevented.  Routine colorectal cancer screening usually begins at age 12 and continues through  age 63.  If you have risk factors for colon cancer, your health care provider may recommend that you be screened at an earlier age.  If you have a family history of colorectal cancer, talk with your health care provider about genetic screening.  Your health care provider may also recommend using home test kits to check for hidden blood in your stool.  A small camera at the end of a tube can be used to examine your colon directly (sigmoidoscopy or colonoscopy). This is done to check for the earliest forms of colorectal cancer.  Direct examination of the colon should be repeated every 5-10 years until  age 75. However, if early forms of precancerous polyps or small growths are found or if you have a family history or genetic risk for colorectal cancer, you may need to be screened more often. Skin Cancer  Check your skin from head to toe regularly.  Monitor any moles. Be sure to tell your health care provider: ? About any new moles or changes in moles, especially if there is a change in a mole's shape or color. ? If you have a mole that is larger than the size of a pencil eraser.  If any of your family members has a history of skin cancer, especially at a young age, talk with your health care provider about genetic screening.  Always use sunscreen. Apply sunscreen liberally and repeatedly throughout the day.  Whenever you are outside, protect yourself by wearing long sleeves, pants, a wide-brimmed hat, and sunglasses. What should I know about osteoporosis? Osteoporosis is a condition in which bone destruction happens more quickly than new bone creation. After menopause, you may be at an increased risk for osteoporosis. To help prevent osteoporosis or the bone fractures that can happen because of osteoporosis, the following is recommended:  If you are 59-59 years old, get at least 1,000 mg of calcium and at least 600 mg of vitamin D per day.  If you are older than age 36 but younger than age 32, get at least 1,200 mg of calcium and at least 600 mg of vitamin D per day.  If you are older than age 47, get at least 1,200 mg of calcium and at least 800 mg of vitamin D per day. Smoking and excessive alcohol intake increase the risk of osteoporosis. Eat foods that are rich in calcium and vitamin D, and do weight-bearing exercises several times each week as directed by your health care provider. What should I know about how menopause affects my mental health? Depression may occur at any age, but it is more common as you become older. Common symptoms of depression include:  Low or sad mood.   Changes in sleep patterns.  Changes in appetite or eating patterns.  Feeling an overall lack of motivation or enjoyment of activities that you previously enjoyed.  Frequent crying spells. Talk with your health care provider if you think that you are experiencing depression. What should I know about immunizations? It is important that you get and maintain your immunizations. These include:  Tetanus, diphtheria, and pertussis (Tdap) booster vaccine.  Influenza every year before the flu season begins.  Pneumonia vaccine.  Shingles vaccine. Your health care provider may also recommend other immunizations. This information is not intended to replace advice given to you by your health care provider. Make sure you discuss any questions you have with your health care provider. Document Released: 11/08/2005 Document Revised: 04/05/2016 Document Reviewed: 06/20/2015 Elsevier Interactive Patient Education  2019 [place].

## 2019-02-12 NOTE — Progress Notes (Signed)
Patient ID: Misty Larsen, female   DOB: 05-Feb-1966, 53 y.o.   MRN: [id number]  Reason for Consult: Dysuria    Subjective:      HPI:  Misty JoGloria Arnell is a 53 y.o. female is being seen today for burning in her vagina when she urinates. "I don't think I drink enough water." She also suspects that the pain could be related to postmenopausal vaginal pain. She requests evaluation for UTI. She has been told that she should not take hormone replacement for post menopausal vaginal pain. Discussed alternative treatments for vaginal atrophy symptoms. She declines vaginal exam today.  Past Medical History:  Diagnosis Date  . Arthritis    FEET  . Diabetes mellitus without complication (HCC)   . GERD (gastroesophageal reflux disease)   . History of hiatal hernia    SMALL PER PT  . HIV (human immunodeficiency virus infection) (HCC)   . Hypertension   . Hypothyroidism   . Overactive bladder   . Pneumonia 2007   Family History  Problem Relation Age of Onset  . Breast cancer Mother    Past Surgical History:  Procedure Laterality Date  . BIOPSY THYROID    . BREAST SURGERY     BIOPSY  . CARPAL TUNNEL RELEASE  2012  . CERVIX SURGERY  2013  . COLONOSCOPY  2014   polyps benign  . CRYOTHERAPY  2013-2014  . CYSTOSCOPY  10/30/2017   Procedure: CYSTOSCOPY;  Surgeon: Nadara MustardHarris, Robert P, MD;  Location: [facility] ORS;  Service: Gynecology;;  . JOINT REPLACEMENT Left 2011   HIP  . LAPAROSCOPIC HYSTERECTOMY Bilateral 10/30/2017   Procedure: HYSTERECTOMY TOTAL LAPAROSCOPIC BILATERAL SALPINGECTOMY;  Surgeon: Nadara MustardHarris, Robert P, MD;  Location: [facility] ORS;  Service: Gynecology;  Laterality: Bilateral;    Short Social History:  Social History   Tobacco Use  . Smoking status: Former Smoker    Packs/day: 0.50    Years: 41.00    Pack years: 20.50    Types: Cigarettes    Start date: 12/30/1975    Last attempt to quit: 01/05/2017    Years since quitting: 2.1  . Smokeless tobacco: Never Used  Substance Use  Topics  . Alcohol use: Yes    Comment: Occasional    Allergies  Allergen Reactions  . Sulfa Antibiotics Other (See Comments)    Unknown    Current Outpatient Medications  Medication Sig Dispense Refill  . aspirin 81 MG chewable tablet Chew by mouth.    . levothyroxine (SYNTHROID, LEVOTHROID) 50 MCG tablet Take 50 mcg by mouth daily before breakfast.    . losartan (COZAAR) 50 MG tablet Take by mouth.    . metFORMIN (GLUCOPHAGE) 500 MG tablet Take 0.5 tablets (250 mg total) by mouth 2 (two) times daily. 30 tablet 2  . metoprolol tartrate (LOPRESSOR) 50 MG tablet TAKE 1 TABLET BY MOUTH TWICE A DAY 540 tablet 3  . omeprazole (PRILOSEC) 20 MG capsule Take 2 capsules (40 mg total) by mouth 2 (two) times daily. 60 capsule 2  . rosuvastatin (CRESTOR) 5 MG tablet Take 5 mg by mouth at bedtime.     . Vitamin D, Ergocalciferol, (DRISDOL) 50000 units CAPS capsule TAKE 1 CAPSULE BY MOUTH ONCE WEEKLY 12 capsule 0  . abacavir-dolutegravir-lamiVUDine (TRIUMEQ) 600-50-300 MG tablet Take 1 tablet by mouth every morning. (Patient not taking: Reported on 02/10/2019) 90 tablet 1  . Cholecalciferol (VITAMIN D) 2000 units tablet Take by mouth.    . diclofenac sodium (VOLTAREN) 1 % GEL APPLY 2 GRAMS TO  AFFECTED AREA 4 TIMES A DAY    . DOVATO 50-300 MG TABS Take 1 tablet by mouth daily.    . Lancets 30G MISC Blood sugar testing done QAM prior to meals - dx E11.65 (Patient not taking: Reported on 02/10/2019) 100 each 4  . losartan-hydrochlorothiazide (HYZAAR) 100-12.5 MG tablet Take 1 tablet by mouth daily. (Patient not taking: Reported on 02/10/2019) 90 tablet 1  . meloxicam (MOBIC) 15 MG tablet Take 15 mg by mouth daily.    . NONFORMULARY OR COMPOUNDED ITEM Apply 1-2 g topically daily. [employer] Nail lacquer: Fluconazole 2%, Terbinafine 1%, DMSO 120 each 11  . Ospemifene (OSPHENA) 60 MG TABS Take 1 tablet by mouth daily. (Patient not taking: Reported on 02/10/2019) 30 tablet 11  . solifenacin (VESICARE) 10 MG  tablet Take 1 tablet (10 mg total) by mouth daily. (Patient not taking: Reported on 04/06/2018) 30 tablet 6  . terbinafine (LAMISIL) 250 MG tablet Take 1 tablet (250 mg total) by mouth daily. (Patient not taking: Reported on 02/10/2019) 30 tablet 0   No current facility-administered medications for this visit.     Review of Systems  Constitutional: Negative.   HENT: Negative.   Eyes: Negative.   Respiratory: Negative.   Cardiovascular: Negative.   Gastrointestinal: Negative.   Genitourinary:       Vaginal burning  Musculoskeletal: Negative.   Skin: Negative.   Neurological: Negative.   Endo/Heme/Allergies: Negative.   Psychiatric/Behavioral: Negative.         Objective:  Objective   Vitals:   02/10/19 1530  BP: 134/84  Weight: 277 lb (125.6 kg)  Height: 5\' 7"  (1.702 m)   Body mass index is 43.38 kg/m. Constitutional: Well nourished, well developed female in no acute distress.  HEENT: normal Skin: Warm and dry.    Respiratory:  Normal respiratory effort Back: no CVAT Neuro: DTRs 2+, Cranial nerves grossly intact Psych: Alert and Oriented x3. No memory deficits. Normal mood and affect.    Data: Results for JAHARA, CLUCAS (MRN [id number]) as of 02/12/2019 13:11  Ref. Range 02/10/2019 15:57  Bilirubin, UA Unknown neg  Clarity, UA Unknown cloudy  Color, UA Unknown yellow  Glucose Latest Ref Range: Negative  Negative  Ketones, UA Unknown neg  Leukocytes,UA Latest Ref Range: Negative  Negative  Nitrite, UA Unknown neg  pH, UA Latest Ref Range: 5.0 - 8.0  7.0  Protein,UA Latest Ref Range: Negative  Negative  Specific Gravity, UA Latest Ref Range: 1.010 - 1.025  1.020  Urobilinogen, UA Latest Ref Range: 0.2 or 1.0 E.U./dL negative (A)  RBC, UA Unknown neg   Status:  Final result Visible to patient:  No (Not Released) Next appt:  None Dx:  Dysuria  Specimen Information: Urine     Component 2d ago  Urine Culture, Routine Final report   Organism ID, Bacteria Comment    Comment: Mixed urogenital flora  10,000-25,000 colony forming units per mL   Resulting Agency [facility]    Narrative  Performed by: Verdell Carmine  Performed at: [address], [place], [place] [id number] Lab Director: Jolene Schimke MD, Phone: [phone]    Specimen Collected: 02/10/19 16:32 Last Resulted: 02/12/19 05:35              Assessment/Plan:    53 yo G0P0 Post Menopausal female with likely symptoms of vaginal atrophy  Follow up as needed Coconut oil for lubrication Increase hydration See AVS for further instructions   Parke Poisson, CNM [facility] 02/12/2019, 5:35 PM

## 2019-05-13 ENCOUNTER — Emergency Department: Payer: Medicare Other

## 2019-05-13 ENCOUNTER — Other Ambulatory Visit: Payer: Self-pay

## 2019-05-13 ENCOUNTER — Emergency Department
Admission: EM | Admit: 2019-05-13 | Discharge: 2019-05-13 | Disposition: A | Discharge status: Home / Self Care | Payer: Medicare Other | Attending: Emergency Medicine | Admitting: Emergency Medicine

## 2019-05-13 DIAGNOSIS — Z79899 Other long term (current) drug therapy: Secondary | ICD-10-CM | POA: Insufficient documentation

## 2019-05-13 DIAGNOSIS — K219 Gastro-esophageal reflux disease without esophagitis: Secondary | ICD-10-CM | POA: Diagnosis not present

## 2019-05-13 DIAGNOSIS — R079 Chest pain, unspecified: Secondary | ICD-10-CM | POA: Diagnosis not present

## 2019-05-13 DIAGNOSIS — B20 Human immunodeficiency virus [HIV] disease: Secondary | ICD-10-CM | POA: Insufficient documentation

## 2019-05-13 DIAGNOSIS — E039 Hypothyroidism, unspecified: Secondary | ICD-10-CM | POA: Insufficient documentation

## 2019-05-13 DIAGNOSIS — Z87891 Personal history of nicotine dependence: Secondary | ICD-10-CM | POA: Insufficient documentation

## 2019-05-13 DIAGNOSIS — Z96642 Presence of left artificial hip joint: Secondary | ICD-10-CM | POA: Diagnosis not present

## 2019-05-13 DIAGNOSIS — Z7984 Long term (current) use of oral hypoglycemic drugs: Secondary | ICD-10-CM | POA: Insufficient documentation

## 2019-05-13 DIAGNOSIS — E119 Type 2 diabetes mellitus without complications: Secondary | ICD-10-CM | POA: Diagnosis not present

## 2019-05-13 DIAGNOSIS — I10 Essential (primary) hypertension: Secondary | ICD-10-CM | POA: Diagnosis not present

## 2019-05-13 DIAGNOSIS — R1013 Epigastric pain: Secondary | ICD-10-CM | POA: Diagnosis present

## 2019-05-13 LAB — BASIC METABOLIC PANEL
Anion gap: 8 (ref 5–15)
BUN: 15 mg/dL (ref 6–20)
CO2: 23 mmol/L (ref 22–32)
Calcium: 9 mg/dL (ref 8.9–10.3)
Chloride: 108 mmol/L (ref 98–111)
Creatinine, Ser: 0.88 mg/dL (ref 0.44–1.00)
GFR calc Af Amer: >60 mL/min (ref >60)
GFR calc non Af Amer: >60 mL/min (ref >60)
Glucose, Bld: 163 mg/dL — ABNORMAL HIGH (ref 70–99)
Potassium: 3.7 mmol/L (ref 3.5–5.1)
Sodium: 139 mmol/L (ref 135–145)

## 2019-05-13 LAB — HEPATIC FUNCTION PANEL
ALT: 38 U/L (ref 0–44)
AST: 31 U/L (ref 15–41)
Albumin: 4.2 g/dL (ref 3.5–5.0)
Alkaline Phosphatase: 61 U/L (ref 38–126)
Bilirubin, Direct: 0.1 mg/dL (ref 0.0–0.2)
Indirect Bilirubin: 0.7 mg/dL (ref 0.3–0.9)
Total Bilirubin: 0.8 mg/dL (ref 0.3–1.2)
Total Protein: 8.6 g/dL — ABNORMAL HIGH (ref 6.5–8.1)

## 2019-05-13 LAB — CBC
HCT: 48.8 % — ABNORMAL HIGH (ref 36.0–46.0)
Hemoglobin: 15.3 g/dL — ABNORMAL HIGH (ref 12.0–15.0)
MCH: 27.2 pg (ref 26.0–34.0)
MCHC: 31.4 g/dL (ref 30.0–36.0)
MCV: 86.8 fL (ref 80.0–100.0)
Platelets: 226 10*3/uL (ref 150–400)
RBC: 5.62 MIL/uL — ABNORMAL HIGH (ref 3.87–5.11)
RDW: 15.5 % (ref 11.5–15.5)
WBC: 8.1 10*3/uL (ref 4.0–10.5)
nRBC: 0 % (ref 0.0–0.2)

## 2019-05-13 LAB — TROPONIN I (HIGH SENSITIVITY)
Troponin I (High Sensitivity): 3 ng/L (ref <18)
Troponin I (High Sensitivity): 3 ng/L (ref <18)

## 2019-05-13 MED ORDER — SUCRALFATE 1 G PO TABS: 1.0000 g | ORAL_TABLET | Freq: Four times a day (QID) | ORAL | 0 refills | Status: DC | PRN

## 2019-05-13 MED ORDER — SODIUM CHLORIDE 0.9% FLUSH: 3.0000 mL | Freq: Once | INTRAVENOUS | Status: DC

## 2019-05-13 NOTE — ED Provider Notes (Signed)
[facility] Emergency Department Provider Note ____________________________________________   First MD Initiated Contact with Patient 05/13/19 1613     (approximate)  I have reviewed the triage vital signs and the nursing notes.   HISTORY  Chief Complaint heartburn    HPI Misty Larsen is a 53 y.o. female with PMH as noted below including a history of GERD and a hiatal hernia who presents with epigastric and chest pain, burning in quality, and occurring intermittently over the last 2 days.  The patient states that she has been eating onions, food with tomato sauce, and other spicy dishes over the last several days.  It feels like prior flares of her reflux.  The patient states that she takes omeprazole and has been taking it as prescribed, although it is not relieving the pain.  She has no associated nausea or vomiting and no fever.  The pain is sometimes worse when she changes positions or leans forward.  It is not associated with shortness of breath or lightheadedness.  Past Medical History:  Diagnosis Date  . Arthritis    FEET  . Diabetes mellitus without complication (HCC)   . GERD (gastroesophageal reflux disease)   . History of hiatal hernia    SMALL PER PT  . HIV (human immunodeficiency virus infection) (HCC)   . Hypertension   . Hypothyroidism   . Overactive bladder   . Pneumonia 2007    Patient Active Problem List   Diagnosis Date Noted  . Gastroesophageal reflux disease 01/27/2019  . Conductive hearing loss of left ear 07/24/2018  . Carpal tunnel syndrome, left 07/06/2018  . Carpal tunnel syndrome, right 07/06/2018  . Vaginal dryness 04/06/2018  . Polyp of colon 03/25/2018  . Uncontrolled type 2 diabetes mellitus with hypoglycemia (HCC) 01/25/2018  . Tinea of nail 01/25/2018  . Type 2 diabetes mellitus with hyperglycemia (HCC) 10/15/2017  . Chronic pelvic pain in female 10/03/2017  . Vaginal atrophy 08/27/2017  . Hypothyroidism  06/09/2017  . Hypercholesteremia 06/09/2017  . HIV (human immunodeficiency virus infection) (HCC) 06/09/2017  . Hypertension 06/09/2017  . Endometrial thickening on ultrasound 06/09/2017    Past Surgical History:  Procedure Laterality Date  . BIOPSY THYROID    . BREAST SURGERY     BIOPSY  . CARPAL TUNNEL RELEASE  2012  . CERVIX SURGERY  2013  . COLONOSCOPY  2014   polyps benign  . CRYOTHERAPY  2013-2014  . CYSTOSCOPY  10/30/2017   Procedure: CYSTOSCOPY;  Surgeon: Nadara MustardHarris, Robert P, MD;  Location: [facility] ORS;  Service: Gynecology;;  . JOINT REPLACEMENT Left 2011   HIP  . LAPAROSCOPIC HYSTERECTOMY Bilateral 10/30/2017   Procedure: HYSTERECTOMY TOTAL LAPAROSCOPIC BILATERAL SALPINGECTOMY;  Surgeon: Nadara MustardHarris, Robert P, MD;  Location: [facility] ORS;  Service: Gynecology;  Laterality: Bilateral;    Prior to Admission medications   Medication Sig Start Date End Date Taking? Authorizing Provider  abacavir-dolutegravir-lamiVUDine (TRIUMEQ) 600-50-300 MG tablet Take 1 tablet by mouth every morning. Patient not taking: Reported on 02/10/2019 10/16/17   Carlean JewsBoscia, Heather E, NP  Cholecalciferol (VITAMIN D) 2000 units tablet Take by mouth. 04/30/18   [provider]  diclofenac sodium (VOLTAREN) 1 % GEL APPLY 2 GRAMS TO AFFECTED AREA 4 TIMES A DAY 01/27/19   [provider]  DOVATO 50-300 MG TABS Take 1 tablet by mouth daily. 12/10/18   [provider]  Lancets 30G MISC Blood sugar testing done QAM prior to meals - dx E11.65 Patient not taking: Reported on 02/10/2019 10/16/17  Ronnell Freshwater, NP  levothyroxine (SYNTHROID, LEVOTHROID) 50 MCG tablet Take 50 mcg by mouth daily before breakfast.    [provider]  losartan (COZAAR) 50 MG tablet Take by mouth. 06/10/18 06/10/19  [provider]  losartan-hydrochlorothiazide (HYZAAR) 100-12.5 MG tablet Take 1 tablet by mouth daily. Patient not taking: Reported on 02/10/2019 11/07/17   Ronnell Freshwater, NP  meloxicam (MOBIC)  15 MG tablet Take 15 mg by mouth daily. 01/27/19   [provider]  metFORMIN (GLUCOPHAGE) 500 MG tablet Take 0.5 tablets (250 mg total) by mouth 2 (two) times daily. 01/02/18   Ronnell Freshwater, NP  metoprolol tartrate (LOPRESSOR) 50 MG tablet TAKE 1 TABLET BY MOUTH TWICE A DAY 04/20/18   Lavera Guise, MD  NONFORMULARY OR COMPOUNDED ITEM Apply 1-2 g topically daily. [employer] Nail lacquer: Fluconazole 2%, Terbinafine 1%, DMSO 03/02/18   Evans, Dorathy Daft, DPM  omeprazole (PRILOSEC) 20 MG capsule Take 2 capsules (40 mg total) by mouth 2 (two) times daily. 03/19/18   Ronnell Freshwater, NP  Ospemifene (OSPHENA) 60 MG TABS Take 1 tablet by mouth daily. Patient not taking: Reported on 02/10/2019 04/06/18   Gae Dry, MD  rosuvastatin (CRESTOR) 5 MG tablet Take 5 mg by mouth at bedtime.     [provider]  solifenacin (VESICARE) 10 MG tablet Take 1 tablet (10 mg total) by mouth daily. Patient not taking: Reported on 04/06/2018 12/29/17   Gae Dry, MD  sucralfate (CARAFATE) 1 g tablet Take 1 tablet (1 g total) by mouth 4 (four) times daily as needed for up to 7 days. 05/13/19 05/20/19  Arta Silence, MD  terbinafine (LAMISIL) 250 MG tablet Take 1 tablet (250 mg total) by mouth daily. Patient not taking: Reported on 02/10/2019 04/17/18   Edrick Kins, DPM  Vitamin D, Ergocalciferol, (DRISDOL) 50000 units CAPS capsule TAKE 1 CAPSULE BY MOUTH ONCE WEEKLY 12/09/17   Ronnell Freshwater, NP    Allergies Sulfa antibiotics  Family History  Problem Relation Age of Onset  . Breast cancer Mother     Social History Social History   Tobacco Use  . Smoking status: Former Smoker    Packs/day: 0.50    Years: 41.00    Pack years: 20.50    Types: Cigarettes    Start date: 12/30/1975    Quit date: 01/05/2017    Years since quitting: 2.3  . Smokeless tobacco: Never Used  Substance Use Topics  . Alcohol use: Yes    Comment: Occasional  . Drug use: No    Types: "Crack" cocaine     Comment: PT STATES SHE HAS BEEN CLEAN FOR 6 YEARS NOW     Review of Systems  Constitutional: No fever. Eyes: No redness. ENT: No sore throat. Cardiovascular: Positive for chest pain. Respiratory: Denies shortness of breath. Gastrointestinal: No vomiting. Genitourinary: Negative for dysuria.  Musculoskeletal: Negative for back pain. Skin: Negative for rash. Neurological: Negative for headache.   ____________________________________________   PHYSICAL EXAM:  VITAL SIGNS: ED Triage Vitals  Enc Vitals Group     BP 05/13/19 1402 (!) 166/88     Pulse Rate 05/13/19 1402 84     Resp 05/13/19 1402 16     Temp 05/13/19 1402 99.4 F (37.4 C)     Temp Source 05/13/19 1402 Oral     SpO2 05/13/19 1402 97 %     Weight 05/13/19 1418 260 lb (117.9 kg)     Height 05/13/19 1418 5'  7" (1.702 m)     Head Circumference --      Peak Flow --      Pain Score 05/13/19 1414 0     Pain Loc --      Pain Edu? --      Excl. in GC? --     Constitutional: Alert and oriented. Well appearing and in no acute distress. Eyes: Conjunctivae are normal.  No scleral icterus. Head: Atraumatic. Nose: No congestion/rhinnorhea. Mouth/Throat: Mucous membranes are moist.   Neck: Normal range of motion.  Cardiovascular: Good peripheral circulation. Respiratory: Normal respiratory effort.  No retractions.  Gastrointestinal: No distention.  Genitourinary: No flank tenderness. Musculoskeletal: No lower extremity edema.  Extremities warm and well perfused.  Neurologic:  Normal speech and language.  Motor and sensory intact in all extremities.  No gross focal neurologic deficits are appreciated.  Skin:  Skin is warm and dry. No rash noted. Psychiatric: Mood and affect are normal. Speech and behavior are normal.  ____________________________________________   LABS (all labs ordered are listed, but only abnormal results are displayed)  Labs Reviewed  BASIC METABOLIC PANEL - Abnormal; Notable for the following  components:      Result Value   Glucose, Bld 163 (*)    All other components within normal limits  CBC - Abnormal; Notable for the following components:   RBC 5.62 (*)    Hemoglobin 15.3 (*)    HCT 48.8 (*)    All other components within normal limits  HEPATIC FUNCTION PANEL - Abnormal; Notable for the following components:   Total Protein 8.6 (*)    All other components within normal limits  POC URINE PREG, ED  TROPONIN I (HIGH SENSITIVITY)  TROPONIN I (HIGH SENSITIVITY)   ____________________________________________  EKG  ED ECG REPORT I, Dionne BucySebastian Alick Lecomte, the attending physician, personally viewed and interpreted this ECG.  Date: 05/13/2019 EKG Time: 1410 Rate: 88 Rhythm: normal sinus rhythm QRS Axis: normal Intervals: normal ST/T Wave abnormalities: normal Narrative Interpretation: no evidence of acute ischemia  ____________________________________________  RADIOLOGY  CXR: No focal infiltrate or other acute abnormality  ____________________________________________   PROCEDURES  Procedure(s) performed: No  Procedures  Critical Care performed: No ____________________________________________   INITIAL IMPRESSION / ASSESSMENT AND PLAN / ED COURSE  Pertinent labs & imaging results that were available during my care of the patient were reviewed by me and considered in my medical decision making (see chart for details).  53 year old female with PMH as noted above presents with atypical epigastric and chest pain over the last few days which feels similar to prior GERD and is somewhat positional.  She has no associated nausea or vomiting, shortness of breath, or lightheadedness or any other worrisome symptoms for a cardiac etiology.  She also incidentally reported some transient numbness or altered sensation in the left leg a few days ago which has resolved.  She has no neurologic symptoms currently.  On exam, the patient is well-appearing.  Her vital signs are  normal.  The physical exam is unremarkable.  EKG is nonischemic.  The patient is asymptomatic at this time.  Overall presentation is consistent with exacerbation of the patient's GERD.  Initial lab work-up including high-sensitivity troponin is unremarkable.  We will obtain a repeat troponin and LFTs that were not ordered from triage.  If these are within normal limits, I anticipate discharge home.  I will prescribe Carafate for additional symptomatic relief and I counseled the patient on appropriate diet choices for GERD.  ----------------------------------------- 6:32  PM on 05/13/2019 -----------------------------------------  Repeat troponin is negative and the LFTs are unremarkable.  The patient has no recurrence of her symptoms and is stable for discharge home.  Return precautions and dietary information have been given, the patient expresses understanding.  ____________________________________________   FINAL CLINICAL IMPRESSION(S) / ED DIAGNOSES  Final diagnoses:  Gastroesophageal reflux disease, esophagitis presence not specified      NEW MEDICATIONS STARTED DURING THIS VISIT:  New Prescriptions   SUCRALFATE (CARAFATE) 1 G TABLET    Take 1 tablet (1 g total) by mouth 4 (four) times daily as needed for up to 7 days.     Note:  This document was prepared using Dragon voice recognition software and may include unintentional dictation errors.    Dionne BucySiadecki, Pavle Wiler, MD 05/13/19 [phone]

## 2019-05-13 NOTE — Discharge Instructions (Addendum)
You can take the Carafate as needed over the next several days to help with the reflux symptoms.  We have provided information on some dietary recommendations as well.  Return to the ER for new, worsening, or persistent severe pain, difficulty breathing, vomiting, weakness, or any other new or worsening symptoms that concern you.

## 2019-05-13 NOTE — ED Triage Notes (Signed)
First Nurse Note:  C/O intermittent chest pain x 2 days.  States pain worse with any twisting motion.  AAOx3.  Skin warm and dry.  No SOB/ DOE.  NAD

## 2019-05-13 NOTE — ED Triage Notes (Addendum)
Pt comes via POV with c/o heartburn and indigestion that started Monday and has just gotten worse. Pt states she has been eating lots of spicy foods lately.  Pt denies any N/V. Pt also states Tuesday night her left leg felt numb.  Pt states it is in the center but now has moved. Pt states it comes and goes

## 2019-05-13 NOTE — ED Notes (Signed)
ED Provider at bedside. 

## 2019-08-11 DIAGNOSIS — Z6841 Body Mass Index (BMI) 40.0 and over, adult: Secondary | ICD-10-CM | POA: Insufficient documentation

## 2019-09-03 ENCOUNTER — Ambulatory Visit (INDEPENDENT_AMBULATORY_CARE_PROVIDER_SITE_OTHER): Payer: Medicare Other | Admitting: Podiatry

## 2019-09-03 ENCOUNTER — Other Ambulatory Visit: Payer: Self-pay

## 2019-09-03 ENCOUNTER — Encounter: Payer: Self-pay | Admitting: Podiatry

## 2019-09-03 DIAGNOSIS — E0843 Diabetes mellitus due to underlying condition with diabetic autonomic (poly)neuropathy: Secondary | ICD-10-CM

## 2019-09-03 DIAGNOSIS — B351 Tinea unguium: Secondary | ICD-10-CM

## 2019-09-03 DIAGNOSIS — M76821 Posterior tibial tendinitis, right leg: Secondary | ICD-10-CM

## 2019-09-03 DIAGNOSIS — M79676 Pain in unspecified toe(s): Secondary | ICD-10-CM

## 2019-09-03 DIAGNOSIS — M19079 Primary osteoarthritis, unspecified ankle and foot: Secondary | ICD-10-CM | POA: Diagnosis not present

## 2019-09-03 MED ORDER — MELOXICAM 15 MG PO TABS: 15.0000 mg | ORAL_TABLET | Freq: Every day | ORAL | 2 refills | Status: DC

## 2019-09-07 NOTE — Progress Notes (Signed)
   SUBJECTIVE 53 year old female presenting today for follow up evaluation of right foot pain. She reports continued pain that is the same as it was previously. Walking increases the pain. She has not had any recent treatment. Patient is here for further evaluation and treatment.   Past Medical History:  Diagnosis Date  . Arthritis    FEET  . Diabetes mellitus without complication ([place])   . GERD (gastroesophageal reflux disease)   . History of hiatal hernia    SMALL PER PT  . HIV (human immunodeficiency virus infection) ([place])   . Hypertension   . Hypothyroidism   . Overactive bladder   . Pneumonia 2007    OBJECTIVE General Patient is awake, alert, and oriented x 3 and in no acute distress. Derm Skin is dry and supple bilateral. Negative open lesions or macerations.  Vasc  DP and PT pedal pulses palpable bilaterally. Temperature gradient within normal limits.  Neuro Epicritic and protective threshold sensation diminished bilaterally.  Musculoskeletal Exam Pain on palpation noted to the posterior tibial tendon of the right foot with medial longitudinal arch collapse noted.  Pain on palpation noted to the posterior tibial tendon of the right foot.  No symptomatic pedal deformities noted bilateral. Muscular strength within normal limits.  ASSESSMENT 1. Diabetes Mellitus w/ peripheral neuropathy 2. Pes planus bilateral with DJD 3. Posterior tibial tendinitis right  4. PTTD right   PLAN OF CARE 1. Patient evaluated today. 2. Declined injections.  3. Appointment with Liliane Channel, Pedorthist, for AFO brace. 4. Prescription for Meloxicam provided to patient. 5. Recommended good shoe gear.  6. Return to clinic as needed.     Edrick Kins, DPM [facility]  Dr. Edrick Kins, [address]                                        [place], [place] [address]                Office [phone]  Fax [phone]

## 2019-09-29 DIAGNOSIS — N3281 Overactive bladder: Secondary | ICD-10-CM | POA: Insufficient documentation

## 2019-10-13 ENCOUNTER — Other Ambulatory Visit: Payer: Medicare Other | Admitting: Orthotics

## 2019-10-20 ENCOUNTER — Ambulatory Visit: Payer: Medicare Other | Admitting: Orthotics

## 2019-10-20 ENCOUNTER — Other Ambulatory Visit: Payer: Self-pay

## 2019-10-20 DIAGNOSIS — B351 Tinea unguium: Secondary | ICD-10-CM

## 2019-10-20 DIAGNOSIS — M79676 Pain in unspecified toe(s): Secondary | ICD-10-CM

## 2019-10-20 DIAGNOSIS — E0843 Diabetes mellitus due to underlying condition with diabetic autonomic (poly)neuropathy: Secondary | ICD-10-CM

## 2019-10-20 DIAGNOSIS — M19079 Primary osteoarthritis, unspecified ankle and foot: Secondary | ICD-10-CM

## 2019-10-20 DIAGNOSIS — M76821 Posterior tibial tendinitis, right leg: Secondary | ICD-10-CM

## 2019-10-20 NOTE — Progress Notes (Signed)
She definitely would benefit from AZ brace as she is flexible pes planovalgus with history of PTTD and DJD R > L; however, she reports she had an AZ brace previoulsly in [place] and had mixed results with it.   She doesn't have the brace any longer, but reports she got it what she thinks is sometime in 2017.  I cast her but will not submit the order until Dawn verifies that we will not get into a same/similiar conflict with brace she got less than 4 years ago in [facility]

## 2019-11-19 LAB — COLOGUARD: COLOGUARD: NEGATIVE

## 2020-02-15 ENCOUNTER — Ambulatory Visit: Payer: Medicare Other | Admitting: Podiatry

## 2020-03-07 ENCOUNTER — Ambulatory Visit: Payer: Medicare Other

## 2020-03-07 ENCOUNTER — Encounter: Payer: Self-pay | Admitting: Obstetrics and Gynecology

## 2020-03-07 ENCOUNTER — Other Ambulatory Visit: Payer: Self-pay | Admitting: Obstetrics and Gynecology

## 2020-03-07 ENCOUNTER — Other Ambulatory Visit: Payer: Self-pay

## 2020-03-07 ENCOUNTER — Ambulatory Visit (INDEPENDENT_AMBULATORY_CARE_PROVIDER_SITE_OTHER): Payer: Medicare Other | Admitting: Obstetrics and Gynecology

## 2020-03-07 VITALS — BP 108/74 | Ht 67.0 in | Wt 256.0 lb

## 2020-03-07 DIAGNOSIS — R102 Pelvic and perineal pain: Secondary | ICD-10-CM | POA: Diagnosis not present

## 2020-03-07 DIAGNOSIS — R350 Frequency of micturition: Secondary | ICD-10-CM

## 2020-03-07 LAB — POCT URINALYSIS DIPSTICK
Bilirubin, UA: NEGATIVE
Blood, UA: NEGATIVE
Glucose, UA: NEGATIVE
Ketones, UA: NEGATIVE
Leukocytes, UA: NEGATIVE
Nitrite, UA: NEGATIVE
Protein, UA: NEGATIVE
Spec Grav, UA: 1.02 (ref 1.010–1.025)
pH, UA: 5 (ref 5.0–8.0)

## 2020-03-07 NOTE — Patient Instructions (Signed)
I value your feedback and entrusting us with your care. If you get a [facility] patient survey, I would appreciate you taking the time to let us know about your experience today. Thank you!  As of September 09, 2019, your lab results will be released to your MyChart immediately, before I even have a chance to see them. Please give me time to review them and contact you if there are any abnormalities. Thank you for your patience.  

## 2020-03-07 NOTE — Progress Notes (Signed)
Janalyn Harder, NP   Chief Complaint  Patient presents with  . Pelvic Pain    has some urinary frequency and burning, abd left side pain since last week    HPI:      Ms. Kurt Azimi is a 54 y.o. G0P0000 whose LMP was Patient's last menstrual period was 07/05/2011 (approximate)., presents today for 1 episode achy pelvic pain last wk. Sx resolved after BM, no pain since. No hx of constipation/GI sx. Pt has  urinary frequency/OAB, but sx improved with ditropan use. Has occas dysuria but due to vaginal dryness. Also uses scented soaps and dryer sheets. Vaginal dryness improved. No other vag sx. S/p lap hyst. She is not sex active. Had a few days of LT flank pain last wk that resolved. No hematuria, fevers. No sx today, just wanted to be checked.    Past Medical History:  Diagnosis Date  . Arthritis    FEET  . Diabetes mellitus without complication (HCC)   . GERD (gastroesophageal reflux disease)   . History of hiatal hernia    SMALL PER PT  . HIV (human immunodeficiency virus infection) (HCC)   . Hypertension   . Hypothyroidism   . Overactive bladder   . Pneumonia 2007    Past Surgical History:  Procedure Laterality Date  . BIOPSY THYROID    . BREAST SURGERY     BIOPSY  . CARPAL TUNNEL RELEASE  2012  . CERVIX SURGERY  2013  . COLONOSCOPY  2014   polyps benign  . CRYOTHERAPY  2013-2014  . CYSTOSCOPY  10/30/2017   Procedure: CYSTOSCOPY;  Surgeon: Nadara Mustard, MD;  Location: [facility] ORS;  Service: Gynecology;;  . JOINT REPLACEMENT Left 2011   HIP  . LAPAROSCOPIC HYSTERECTOMY Bilateral 10/30/2017   Procedure: HYSTERECTOMY TOTAL LAPAROSCOPIC BILATERAL SALPINGECTOMY;  Surgeon: Nadara Mustard, MD;  Location: [facility] ORS;  Service: Gynecology;  Laterality: Bilateral;    Family History  Problem Relation Age of Onset  . Breast cancer Mother     Social History   Socioeconomic History  . Marital status: Single    Spouse name: Not on file  . Number of  children: Not on file  . Years of education: Not on file  . Highest education level: Not on file  Occupational History  . Not on file  Tobacco Use  . Smoking status: Former Smoker    Packs/day: 0.50    Years: 41.00    Pack years: 20.50    Types: Cigarettes    Start date: 12/30/1975    Quit date: 01/05/2017    Years since quitting: 3.1  . Smokeless tobacco: Never Used  Substance and Sexual Activity  . Alcohol use: Yes    Comment: Occasional  . Drug use: No    Types: "Crack" cocaine    Comment: PT STATES SHE HAS BEEN CLEAN FOR 6 YEARS NOW   . Sexual activity: Not Currently    Birth control/protection: Post-menopausal  Other Topics Concern  . Not on file  Social History Narrative  . Not on file   Social Determinants of Health   Financial Resource Strain:   . Difficulty of Paying Living Expenses:   Food Insecurity:   . Worried About Programme researcher, broadcasting/film/video in the Last Year:   . Barista in the Last Year:   Transportation Needs:   . Freight forwarder (Medical):   Marland Kitchen Lack of Transportation (Non-Medical):   Physical Activity:   . Days  of Exercise per Week:   . Minutes of Exercise per Session:   Stress:   . Feeling of Stress :   Social Connections:   . Frequency of Communication with Friends and Family:   . Frequency of Social Gatherings with Friends and Family:   . Attends Religious Services:   . Active Member of Clubs or Organizations:   . Attends Banker Meetings:   Marland Kitchen Marital Status:   Intimate Partner Violence:   . Fear of Current or Ex-Partner:   . Emotionally Abused:   Marland Kitchen Physically Abused:   . Sexually Abused:     Outpatient Medications Prior to Visit  Medication Sig Dispense Refill  . Acetaminophen-Codeine 300-15 MG TABS Take by mouth.    . DOVATO 50-300 MG TABS Take 1 tablet by mouth daily.    . famotidine (PEPCID) 40 MG tablet Take by mouth.    . levothyroxine (SYNTHROID, LEVOTHROID) 50 MCG tablet Take 50 mcg by mouth daily before  breakfast.    . metFORMIN (GLUCOPHAGE) 500 MG tablet Take 0.5 tablets (250 mg total) by mouth 2 (two) times daily. 30 tablet 2  . metoprolol succinate (TOPROL-XL) 100 MG 24 hr tablet Take 100 mg by mouth daily.    Marland Kitchen omeprazole (PRILOSEC) 40 MG capsule Take 40 mg by mouth daily.    Marland Kitchen oxybutynin (DITROPAN-XL) 10 MG 24 hr tablet Take by mouth.    . rosuvastatin (CRESTOR) 10 MG tablet Take by mouth.    . RYBELSUS 7 MG TABS SMARTSIG:7 Milligram(s) By Mouth Daily    . abacavir-dolutegravir-lamiVUDine (TRIUMEQ) 600-50-300 MG tablet Take 1 tablet by mouth every morning. 90 tablet 1  . Cholecalciferol (VITAMIN D) 2000 units tablet Take by mouth.    . diazepam (VALIUM) 5 MG tablet once as needed    . diclofenac sodium (VOLTAREN) 1 % GEL APPLY 2 GRAMS TO AFFECTED AREA 4 TIMES A DAY    . Lancets 30G MISC Blood sugar testing done QAM prior to meals - dx E11.65 100 each 4  . losartan-hydrochlorothiazide (HYZAAR) 100-12.5 MG tablet Take 1 tablet by mouth daily. 90 tablet 1  . meloxicam (MOBIC) 15 MG tablet Take 1 tablet (15 mg total) by mouth daily. 30 tablet 2  . metoprolol tartrate (LOPRESSOR) 50 MG tablet TAKE 1 TABLET BY MOUTH TWICE A DAY 540 tablet 3  . NONFORMULARY OR COMPOUNDED ITEM Apply 1-2 g topically daily. [employer] Nail lacquer: Fluconazole 2%, Terbinafine 1%, DMSO 120 each 11  . omeprazole (PRILOSEC) 20 MG capsule Take 2 capsules (40 mg total) by mouth 2 (two) times daily. 60 capsule 2  . Ospemifene (OSPHENA) 60 MG TABS Take 1 tablet by mouth daily. 30 tablet 11  . rosuvastatin (CRESTOR) 5 MG tablet Take 5 mg by mouth at bedtime.     . RYBELSUS 3 MG TABS Take 3 mg by mouth daily.    . solifenacin (VESICARE) 10 MG tablet Take 1 tablet (10 mg total) by mouth daily. 30 tablet 6  . sucralfate (CARAFATE) 1 g tablet Take 1 tablet (1 g total) by mouth 4 (four) times daily as needed for up to 7 days. 15 tablet 0  . terbinafine (LAMISIL) 250 MG tablet Take 1 tablet (250 mg total) by mouth daily. 30 tablet  0  . Vitamin D, Ergocalciferol, (DRISDOL) 50000 units CAPS capsule TAKE 1 CAPSULE BY MOUTH ONCE WEEKLY 12 capsule 0   No facility-administered medications prior to visit.      ROS:  Review of Systems  Constitutional: Negative for fever.  Gastrointestinal: Negative for blood in stool, constipation, diarrhea, nausea and vomiting.  Genitourinary: Positive for frequency and pelvic pain. Negative for dyspareunia, dysuria, flank pain, hematuria, urgency, vaginal bleeding, vaginal discharge and vaginal pain.  Musculoskeletal: Negative for back pain.  Skin: Negative for rash.  BREAST: No symptoms   OBJECTIVE:   Vitals:  BP 108/74   Ht 5\' 7"  (1.702 m)   Wt 256 lb (116.1 kg)   LMP 07/05/2011 (Approximate) Comment: states "it's been about 9 years"  BMI 40.10 kg/m   Physical Exam Vitals reviewed.  Constitutional:      Appearance: She is well-developed.  Pulmonary:     Effort: Pulmonary effort is normal.  Genitourinary:    General: Normal vulva.     Pubic Area: No rash.      Labia:        Right: No rash, tenderness or lesion.        Left: No rash, tenderness or lesion.      Uterus: Absent. Not tender.      Adnexa: Right adnexa normal and left adnexa normal.       Right: No mass or tenderness.         Left: No mass or tenderness.    Musculoskeletal:        General: Normal range of motion.     Cervical back: Normal range of motion.  Skin:    General: Skin is warm and dry.  Neurological:     General: No focal deficit present.     Mental Status: She is alert and oriented to person, place, and time.  Psychiatric:        Mood and Affect: Mood normal.        Behavior: Behavior normal.        Thought Content: Thought content normal.        Judgment: Judgment normal.     Results: Results for orders placed or performed in visit on 03/07/20 (from the past 24 hour(s))  POCT Urinalysis Dipstick     Status: Normal   Collection Time: 03/07/20 11:36 AM  Result Value Ref Range    Color, UA yellow    Clarity, UA clear    Glucose, UA Negative Negative   Bilirubin, UA neg    Ketones, UA neg    Spec Grav, UA 1.020 1.010 - 1.025   Blood, UA neg    pH, UA 5.0 5.0 - 8.0   Protein, UA Negative Negative   Urobilinogen, UA     Nitrite, UA neg    Leukocytes, UA Negative Negative   Appearance     Odor       Assessment/Plan: Urinary frequency - Plan: POCT Urinalysis Dipstick; Neg UA. No other sx. Reassurance. Cont ditropan for OAB.   Pelvic pain--1 day last wk, resolved after BM. Neg GYN u/s. Reassurance. F/u prn.     Return if symptoms worsen or fail to improve.  Taishaun Levels B. Treyana Sturgell, PA-C 03/07/2020 2:38 PM

## 2020-05-04 ENCOUNTER — Other Ambulatory Visit: Payer: Self-pay

## 2020-05-04 DIAGNOSIS — Z20822 Contact with and (suspected) exposure to covid-19: Secondary | ICD-10-CM | POA: Diagnosis present

## 2020-05-04 DIAGNOSIS — E669 Obesity, unspecified: Secondary | ICD-10-CM | POA: Diagnosis present

## 2020-05-04 DIAGNOSIS — K5732 Diverticulitis of large intestine without perforation or abscess without bleeding: Secondary | ICD-10-CM | POA: Diagnosis not present

## 2020-05-04 DIAGNOSIS — K219 Gastro-esophageal reflux disease without esophagitis: Secondary | ICD-10-CM | POA: Diagnosis present

## 2020-05-04 DIAGNOSIS — M19071 Primary osteoarthritis, right ankle and foot: Secondary | ICD-10-CM | POA: Diagnosis present

## 2020-05-04 DIAGNOSIS — Z882 Allergy status to sulfonamides status: Secondary | ICD-10-CM

## 2020-05-04 DIAGNOSIS — Z7984 Long term (current) use of oral hypoglycemic drugs: Secondary | ICD-10-CM

## 2020-05-04 DIAGNOSIS — Z87891 Personal history of nicotine dependence: Secondary | ICD-10-CM

## 2020-05-04 DIAGNOSIS — Z803 Family history of malignant neoplasm of breast: Secondary | ICD-10-CM

## 2020-05-04 DIAGNOSIS — E119 Type 2 diabetes mellitus without complications: Secondary | ICD-10-CM | POA: Diagnosis present

## 2020-05-04 DIAGNOSIS — E039 Hypothyroidism, unspecified: Secondary | ICD-10-CM | POA: Diagnosis present

## 2020-05-04 DIAGNOSIS — N3281 Overactive bladder: Secondary | ICD-10-CM | POA: Diagnosis present

## 2020-05-04 DIAGNOSIS — Z6839 Body mass index (BMI) 39.0-39.9, adult: Secondary | ICD-10-CM

## 2020-05-04 DIAGNOSIS — Z79890 Hormone replacement therapy (postmenopausal): Secondary | ICD-10-CM

## 2020-05-04 DIAGNOSIS — Z79899 Other long term (current) drug therapy: Secondary | ICD-10-CM

## 2020-05-04 DIAGNOSIS — M19072 Primary osteoarthritis, left ankle and foot: Secondary | ICD-10-CM | POA: Diagnosis present

## 2020-05-04 DIAGNOSIS — R911 Solitary pulmonary nodule: Secondary | ICD-10-CM | POA: Diagnosis present

## 2020-05-04 DIAGNOSIS — I10 Essential (primary) hypertension: Secondary | ICD-10-CM | POA: Diagnosis present

## 2020-05-04 LAB — COMPREHENSIVE METABOLIC PANEL
ALT: 21 U/L (ref 0–44)
AST: 20 U/L (ref 15–41)
Albumin: 4.6 g/dL (ref 3.5–5.0)
Alkaline Phosphatase: 60 U/L (ref 38–126)
Anion gap: 9 (ref 5–15)
BUN: 15 mg/dL (ref 6–20)
CO2: 26 mmol/L (ref 22–32)
Calcium: 9.4 mg/dL (ref 8.9–10.3)
Chloride: 102 mmol/L (ref 98–111)
Creatinine, Ser: 0.92 mg/dL (ref 0.44–1.00)
GFR calc Af Amer: >60 mL/min (ref >60)
GFR calc non Af Amer: >60 mL/min (ref >60)
Glucose, Bld: 103 mg/dL — ABNORMAL HIGH (ref 70–99)
Potassium: 3.9 mmol/L (ref 3.5–5.1)
Sodium: 137 mmol/L (ref 135–145)
Total Bilirubin: 1.3 mg/dL — ABNORMAL HIGH (ref 0.3–1.2)
Total Protein: 9.6 g/dL — ABNORMAL HIGH (ref 6.5–8.1)

## 2020-05-04 LAB — CBC
HCT: 49.9 % — ABNORMAL HIGH (ref 36.0–46.0)
Hemoglobin: 16.6 g/dL — ABNORMAL HIGH (ref 12.0–15.0)
MCH: 28.2 pg (ref 26.0–34.0)
MCHC: 33.3 g/dL (ref 30.0–36.0)
MCV: 84.9 fL (ref 80.0–100.0)
Platelets: 256 10*3/uL (ref 150–400)
RBC: 5.88 MIL/uL — ABNORMAL HIGH (ref 3.87–5.11)
RDW: 15.3 % (ref 11.5–15.5)
WBC: 18.9 10*3/uL — ABNORMAL HIGH (ref 4.0–10.5)
nRBC: 0 % (ref 0.0–0.2)

## 2020-05-04 LAB — LIPASE, BLOOD: Lipase: 23 U/L (ref 11–51)

## 2020-05-04 MED ORDER — SODIUM CHLORIDE 0.9% FLUSH: 3.0000 mL | Freq: Once | INTRAVENOUS | Status: DC

## 2020-05-04 NOTE — ED Notes (Signed)
Pt had CBC and U/A done at [facility]. Will order additional labs.

## 2020-05-04 NOTE — ED Triage Notes (Signed)
Pt arrives via POV for reports of abdominal pain and tenderness that started early this morning. Denies diarrhea, nausea, vomiting. Pt seen at walk in and was told to have a fever of 100.2. Pt A&Ox4 and in NAD, skin warm and dry.

## 2020-05-05 ENCOUNTER — Emergency Department: Payer: Medicare Other

## 2020-05-05 ENCOUNTER — Inpatient Hospital Stay
Admission: EM | Admit: 2020-05-05 | Discharge: 2020-05-08 | DRG: 392 | Disposition: A | Discharge status: Home / Self Care | Payer: Medicare Other | Attending: General Surgery | Admitting: General Surgery

## 2020-05-05 DIAGNOSIS — E039 Hypothyroidism, unspecified: Secondary | ICD-10-CM | POA: Diagnosis present

## 2020-05-05 DIAGNOSIS — N3281 Overactive bladder: Secondary | ICD-10-CM | POA: Diagnosis present

## 2020-05-05 DIAGNOSIS — Z79890 Hormone replacement therapy (postmenopausal): Secondary | ICD-10-CM | POA: Diagnosis not present

## 2020-05-05 DIAGNOSIS — K219 Gastro-esophageal reflux disease without esophagitis: Secondary | ICD-10-CM | POA: Diagnosis present

## 2020-05-05 DIAGNOSIS — K5720 Diverticulitis of large intestine with perforation and abscess without bleeding: Secondary | ICD-10-CM

## 2020-05-05 DIAGNOSIS — Z803 Family history of malignant neoplasm of breast: Secondary | ICD-10-CM | POA: Diagnosis not present

## 2020-05-05 DIAGNOSIS — Z79899 Other long term (current) drug therapy: Secondary | ICD-10-CM | POA: Diagnosis not present

## 2020-05-05 DIAGNOSIS — Z6839 Body mass index (BMI) 39.0-39.9, adult: Secondary | ICD-10-CM | POA: Diagnosis not present

## 2020-05-05 DIAGNOSIS — M19071 Primary osteoarthritis, right ankle and foot: Secondary | ICD-10-CM | POA: Diagnosis present

## 2020-05-05 DIAGNOSIS — K5732 Diverticulitis of large intestine without perforation or abscess without bleeding: Secondary | ICD-10-CM | POA: Diagnosis present

## 2020-05-05 DIAGNOSIS — Z20822 Contact with and (suspected) exposure to covid-19: Secondary | ICD-10-CM | POA: Diagnosis present

## 2020-05-05 DIAGNOSIS — Z882 Allergy status to sulfonamides status: Secondary | ICD-10-CM | POA: Diagnosis not present

## 2020-05-05 DIAGNOSIS — Z7984 Long term (current) use of oral hypoglycemic drugs: Secondary | ICD-10-CM | POA: Diagnosis not present

## 2020-05-05 DIAGNOSIS — I10 Essential (primary) hypertension: Secondary | ICD-10-CM | POA: Diagnosis present

## 2020-05-05 DIAGNOSIS — M19072 Primary osteoarthritis, left ankle and foot: Secondary | ICD-10-CM | POA: Diagnosis present

## 2020-05-05 DIAGNOSIS — Z87891 Personal history of nicotine dependence: Secondary | ICD-10-CM | POA: Diagnosis not present

## 2020-05-05 DIAGNOSIS — E669 Obesity, unspecified: Secondary | ICD-10-CM | POA: Diagnosis present

## 2020-05-05 DIAGNOSIS — R911 Solitary pulmonary nodule: Secondary | ICD-10-CM | POA: Diagnosis present

## 2020-05-05 DIAGNOSIS — E119 Type 2 diabetes mellitus without complications: Secondary | ICD-10-CM | POA: Diagnosis present

## 2020-05-05 LAB — SARS CORONAVIRUS 2 BY RT PCR (HOSPITAL ORDER, PERFORMED IN ~~LOC~~ HOSPITAL LAB): SARS Coronavirus 2: NEGATIVE

## 2020-05-05 LAB — GLUCOSE, CAPILLARY
Glucose-Capillary: 94 mg/dL (ref 70–99)
Glucose-Capillary: 75 mg/dL (ref 70–99)
Glucose-Capillary: 79 mg/dL (ref 70–99)

## 2020-05-05 LAB — HEMOGLOBIN A1C
Hgb A1c MFr Bld: 5.9 % — ABNORMAL HIGH (ref 4.8–5.6)
Mean Plasma Glucose: 122.63 mg/dL

## 2020-05-05 MED ORDER — DOLUTEGRAVIR SODIUM 50 MG PO TABS
50.0000 mg | ORAL_TABLET | Freq: Every day | ORAL | Status: DC
  Administered 2020-05-05: 50 mg via ORAL
  Filled 2020-05-05 (×2): qty 1

## 2020-05-05 MED ORDER — IOHEXOL 350 MG/ML SOLN
100.0000 mL | Freq: Once | INTRAVENOUS | Status: AC | PRN
  Administered 2020-05-05: 100 mL via INTRAVENOUS

## 2020-05-05 MED ORDER — LAMIVUDINE 10 MG/ML PO SOLN
300.0000 mg | Freq: Every day | ORAL | Status: DC
  Administered 2020-05-06: 300 mg via ORAL
  Filled 2020-05-05: qty 30

## 2020-05-05 MED ORDER — PIPERACILLIN-TAZOBACTAM 3.375 G IVPB
3.3750 g | Freq: Three times a day (TID) | INTRAVENOUS | Status: DC
  Administered 2020-05-05 – 2020-05-08 (×9): 3.375 g via INTRAVENOUS
  Filled 2020-05-05 (×9): qty 50

## 2020-05-05 MED ORDER — METOPROLOL SUCCINATE ER 50 MG PO TB24
100.0000 mg | ORAL_TABLET | Freq: Every day | ORAL | Status: DC
  Administered 2020-05-05 – 2020-05-08 (×4): 100 mg via ORAL
  Filled 2020-05-05 (×4): qty 2

## 2020-05-05 MED ORDER — ACETAMINOPHEN 650 MG RE SUPP: 650.0000 mg | Freq: Four times a day (QID) | RECTAL | Status: DC | PRN

## 2020-05-05 MED ORDER — DOLUTEGRAVIR-LAMIVUDINE 50-300 MG PO TABS: 1.0000 | ORAL_TABLET | Freq: Every day | ORAL | Status: DC

## 2020-05-05 MED ORDER — ONDANSETRON 4 MG PO TBDP
4.0000 mg | ORAL_TABLET | Freq: Four times a day (QID) | ORAL | Status: DC | PRN
  Filled 2020-05-05: qty 1

## 2020-05-05 MED ORDER — PIPERACILLIN-TAZOBACTAM 3.375 G IVPB 30 MIN
3.3750 g | Freq: Once | INTRAVENOUS | Status: AC
  Administered 2020-05-05: 3.375 g via INTRAVENOUS
  Filled 2020-05-05: qty 50

## 2020-05-05 MED ORDER — INSULIN ASPART 100 UNIT/ML ~~LOC~~ SOLN: 0.0000 [IU] | SUBCUTANEOUS | Status: DC

## 2020-05-05 MED ORDER — ENOXAPARIN SODIUM 40 MG/0.4ML ~~LOC~~ SOLN
40.0000 mg | SUBCUTANEOUS | Status: DC
  Filled 2020-05-05 (×4): qty 0.4

## 2020-05-05 MED ORDER — HYDROCODONE-ACETAMINOPHEN 5-325 MG PO TABS: 1.0000 | ORAL_TABLET | ORAL | Status: DC | PRN

## 2020-05-05 MED ORDER — SODIUM CHLORIDE 0.9 % IV SOLN: INTRAVENOUS | Status: DC

## 2020-05-05 MED ORDER — ACETAMINOPHEN 325 MG PO TABS
650.0000 mg | ORAL_TABLET | Freq: Four times a day (QID) | ORAL | Status: DC | PRN
  Administered 2020-05-05 – 2020-05-07 (×3): 650 mg via ORAL
  Filled 2020-05-05 (×3): qty 2

## 2020-05-05 MED ORDER — ONDANSETRON HCL 4 MG/2ML IJ SOLN: 4.0000 mg | Freq: Four times a day (QID) | INTRAMUSCULAR | Status: DC | PRN

## 2020-05-05 MED ORDER — DOLUTEGRAVIR SODIUM 50 MG PO TABS
50.0000 mg | ORAL_TABLET | Freq: Every day | ORAL | Status: DC
  Administered 2020-05-06: 50 mg via ORAL
  Filled 2020-05-05: qty 1

## 2020-05-05 MED ORDER — MORPHINE SULFATE (PF) 4 MG/ML IV SOLN: 4.0000 mg | INTRAVENOUS | Status: DC | PRN

## 2020-05-05 MED ORDER — PANTOPRAZOLE SODIUM 40 MG PO TBEC
40.0000 mg | DELAYED_RELEASE_TABLET | Freq: Every day | ORAL | Status: DC
  Administered 2020-05-05 – 2020-05-08 (×4): 40 mg via ORAL
  Filled 2020-05-05 (×5): qty 1

## 2020-05-05 MED ORDER — LEVOTHYROXINE SODIUM 50 MCG PO TABS
50.0000 ug | ORAL_TABLET | Freq: Every day | ORAL | Status: DC
  Administered 2020-05-05 – 2020-05-08 (×4): 50 ug via ORAL
  Filled 2020-05-05 (×4): qty 1

## 2020-05-05 MED ORDER — LAMIVUDINE 150 MG PO TABS
300.0000 mg | ORAL_TABLET | Freq: Every day | ORAL | Status: DC
  Administered 2020-05-05: 300 mg via ORAL
  Filled 2020-05-05 (×2): qty 2

## 2020-05-05 MED ORDER — OXYBUTYNIN CHLORIDE ER 10 MG PO TB24
10.0000 mg | ORAL_TABLET | Freq: Every day | ORAL | Status: DC
  Administered 2020-05-05 – 2020-05-06 (×2): 10 mg via ORAL
  Filled 2020-05-05 (×4): qty 1

## 2020-05-05 NOTE — ED Provider Notes (Signed)
[facility] Emergency Department Provider Note  ____________________________________________   First MD Initiated Contact with Patient 05/05/20 [phone]     (approximate)  I have reviewed the triage vital signs and the nursing notes.   HISTORY  Chief Complaint Abdominal Pain   HPI Misty Larsen is a 54 y.o. female with below list of previous medical conditions presents to the emergency department secondary to 1 day history of abdominal pain without any associated symptoms.  Patient states that current pain score is 2 out of 10.  Patient denies any nausea no vomiting or diarrhea.  Patient was informed that she had a fever at the walk-in clinic of 100.2.  On arrival to emergency department patient's temperature 99.5.  Patient denies any urinary symptoms.        Past Medical History:  Diagnosis Date  . Arthritis    FEET  . Diabetes mellitus without complication (HCC)   . GERD (gastroesophageal reflux disease)   . History of hiatal hernia    SMALL PER PT  . HIV (human immunodeficiency virus infection) (HCC)   . Hypertension   . Hypothyroidism   . Overactive bladder   . Pneumonia 2007    Patient Active Problem List   Diagnosis Date Noted  . Gastroesophageal reflux disease 01/27/2019  . Conductive hearing loss of left ear 07/24/2018  . Carpal tunnel syndrome, left 07/06/2018  . Carpal tunnel syndrome, right 07/06/2018  . Vaginal dryness 04/06/2018  . Polyp of colon 03/25/2018  . Uncontrolled type 2 diabetes mellitus with hypoglycemia (HCC) 01/25/2018  . Tinea of nail 01/25/2018  . Type 2 diabetes mellitus with hyperglycemia (HCC) 10/15/2017  . Chronic pelvic pain in female 10/03/2017  . Vaginal atrophy 08/27/2017  . Hypothyroidism 06/09/2017  . Hypercholesteremia 06/09/2017  . HIV (human immunodeficiency virus infection) (HCC) 06/09/2017  . Hypertension 06/09/2017  . Endometrial thickening on ultrasound 06/09/2017    Past Surgical History:    Procedure Laterality Date  . BIOPSY THYROID    . BREAST SURGERY     BIOPSY  . CARPAL TUNNEL RELEASE  2012  . CERVIX SURGERY  2013  . COLONOSCOPY  2014   polyps benign  . CRYOTHERAPY  2013-2014  . CYSTOSCOPY  10/30/2017   Procedure: CYSTOSCOPY;  Surgeon: Nadara Mustard, MD;  Location: [facility] ORS;  Service: Gynecology;;  . JOINT REPLACEMENT Left 2011   HIP  . LAPAROSCOPIC HYSTERECTOMY Bilateral 10/30/2017   Procedure: HYSTERECTOMY TOTAL LAPAROSCOPIC BILATERAL SALPINGECTOMY;  Surgeon: Nadara Mustard, MD;  Location: [facility] ORS;  Service: Gynecology;  Laterality: Bilateral;    Prior to Admission medications   Medication Sig Start Date End Date Taking? Authorizing Provider  Acetaminophen-Codeine 300-15 MG TABS Take by mouth.    [provider]  DOVATO 50-300 MG TABS Take 1 tablet by mouth daily. 12/10/18   [provider]  famotidine (PEPCID) 40 MG tablet Take by mouth. 11/18/19 11/17/20  [provider]  levothyroxine (SYNTHROID, LEVOTHROID) 50 MCG tablet Take 50 mcg by mouth daily before breakfast.    [provider]  metFORMIN (GLUCOPHAGE) 500 MG tablet Take 0.5 tablets (250 mg total) by mouth 2 (two) times daily. 01/02/18   Carlean Jews, NP  metoprolol succinate (TOPROL-XL) 100 MG 24 hr tablet Take 100 mg by mouth daily. 12/29/19   [provider]  omeprazole (PRILOSEC) 40 MG capsule Take 40 mg by mouth daily. 02/08/20   [provider]  oxybutynin (DITROPAN-XL) 10 MG 24 hr tablet Take by mouth. 09/29/19  09/28/20  [provider]  rosuvastatin (CRESTOR) 10 MG tablet Take by mouth. 12/29/19   [provider]  RYBELSUS 7 MG TABS SMARTSIG:7 Milligram(s) By Mouth Daily 02/28/20   [provider]    Allergies Sulfa antibiotics  Family History  Problem Relation Age of Onset  . Breast cancer Mother     Social History Social History   Tobacco Use  . Smoking status: Former Smoker    Packs/day: 0.50     Years: 41.00    Pack years: 20.50    Types: Cigarettes    Start date: 12/30/1975    Quit date: 01/05/2017    Years since quitting: 3.3  . Smokeless tobacco: Never Used  Vaping Use  . Vaping Use: Former  Substance Use Topics  . Alcohol use: Yes    Comment: Occasional  . Drug use: No    Types: "Crack" cocaine    Comment: PT STATES SHE HAS BEEN CLEAN FOR 6 YEARS NOW     Review of Systems Constitutional: No fever/chills Eyes: No visual changes. ENT: No sore throat. Cardiovascular: Denies chest pain. Respiratory: Denies shortness of breath. Gastrointestinal: Positive for abdominal pain.  No nausea, no vomiting.  No diarrhea.  No constipation. Genitourinary: Negative for dysuria. Musculoskeletal: Negative for neck pain.  Negative for back pain. Integumentary: Negative for rash. Neurological: Negative for headaches, focal weakness or numbness. ___________________   PHYSICAL EXAM:  VITAL SIGNS: ED Triage Vitals  Enc Vitals Group     BP 05/04/20 1716 (!) 142/61     Pulse Rate 05/04/20 1716 (!) 109     Resp 05/04/20 1716 18     Temp 05/04/20 1716 99.5 F (37.5 C)     Temp Source 05/04/20 1716 Oral     SpO2 05/04/20 1716 100 %     Weight 05/04/20 1724 114.3 kg (252 lb)     Height 05/04/20 1724 1.702 m (5\' 7" )     Head Circumference --      Peak Flow --      Pain Score 05/04/20 1717 2     Pain Loc --      Pain Edu? --      Excl. in GC? --     Constitutional: Alert and oriented.  Eyes: Conjunctivae are normal.  Head: Atraumatic. Mouth/Throat: Patient is wearing a mask. Neck: No stridor.  No meningeal signs.   Cardiovascular: Normal rate, regular rhythm. Good peripheral circulation. Grossly normal heart sounds. Respiratory: Normal respiratory effort.  No retractions. Gastrointestinal: Left lower quadrant tenderness to palpation. No distention.  Musculoskeletal: No lower extremity tenderness nor edema. No gross deformities of extremities. Neurologic:  Normal speech and  language. No gross focal neurologic deficits are appreciated.  Skin:  Skin is warm, dry and intact. Psychiatric: Mood and affect are normal. Speech and behavior are normal.  ____________________________________________   LABS (all labs ordered are listed, but only abnormal results are displayed)  Labs Reviewed  COMPREHENSIVE METABOLIC PANEL - Abnormal; Notable for the following components:      Result Value   Glucose, Bld 103 (*)    Total Protein 9.6 (*)    Total Bilirubin 1.3 (*)    All other components within normal limits  CBC - Abnormal; Notable for the following components:   WBC 18.9 (*)    RBC 5.88 (*)    Hemoglobin 16.6 (*)    HCT 49.9 (*)    All other components within normal limits  SARS CORONAVIRUS 2 BY RT PCR [facility](HOSPITAL  ORDER, PERFORMED IN [facility])  LIPASE, BLOOD     RADIOLOGY I, [place] N Neri Vieyra, personally viewed and evaluated these images (plain radiographs) as part of my medical decision making, as well as reviewing the written report by the radiologist.  ED MD interpretation: Sigmoid diverticulitis with a few bubbles of extraluminal gas no drainable collection per radiologist.  Official radiology report(s): CT ABDOMEN PELVIS W CONTRAST  Result Date: 05/05/2020 CLINICAL DATA:  Left lower quadrant pain and fever EXAM: CT ABDOMEN AND PELVIS WITH CONTRAST TECHNIQUE: Multidetector CT imaging of the abdomen and pelvis was performed using the standard protocol following bolus administration of intravenous contrast. CONTRAST:  OMNIPAQUE IOHEXOL 350 MG/ML SOLN COMPARISON:  None. FINDINGS: Lower chest:  4 mm right lower lobe pulmonary nodule. Hepatobiliary: No focal liver abnormality.No evidence of biliary obstruction or stone. Pancreas: Unremarkable. Spleen: Unremarkable. Adrenals/Urinary Tract: Negative adrenals. No hydronephrosis or stone. Unremarkable bladder. Stomach/Bowel: Sigmoid diverticulosis with inflammatory fat stranding around 1 of the  sigmoid diverticula, with a few tiny bubbles of extraluminal gas. No abscess. No appendicitis. Vascular/Lymphatic: No acute vascular abnormality. Scattered atheromatous calcification along the distal aorta-and iliacs. No mass or adenopathy. Reproductive:Hysterectomy. Other: No ascites or pneumoperitoneum. Musculoskeletal: No acute abnormalities. Chronic avascular necrosis of the right femoral head. Left hip arthroplasty. Spinal degeneration, especially the lumbar facets. IMPRESSION: 1. Sigmoid diverticulitis with a few bubbles of extraluminal gas. No drainable collection. 2. 4 mm right lower lobe pulmonary nodule. No follow-up needed if patient is low-risk. Non-contrast chest CT can be considered in 12 months if patient is high-risk. This recommendation follows the consensus statement: Guidelines for Management of Incidental Pulmonary Nodules Detected on CT Images: From the Fleischner Society 2017; Radiology 2017; 284:228-243. Electronically Signed   By: Marnee Spring M.D.   On: 05/05/2020 05:15     Procedures   ____________________________________________   INITIAL IMPRESSION / MDM / ASSESSMENT AND PLAN / ED COURSE  As part of my medical decision making, I reviewed the following data within the electronic MEDICAL RECORD NUMBER  54 year old female presenting with above-stated history and physical exam with differential diagnosis including but not limited to diverticulitis, colitis, appendicitis.  CT scan findings consistent with diverticulitis with possible microperforations given small volume extraluminal gas.  Patient given Zosyn 3.375 mg IV as such patient discussed with Dr. Maia Plan general surgery for hospital admission for further evaluation and management.  ____________________________________________  FINAL CLINICAL IMPRESSION(S) / ED DIAGNOSES  Final diagnoses:  Diverticulitis of large intestine with perforation without bleeding     MEDICATIONS GIVEN DURING THIS VISIT:  Medications   sodium chloride flush (NS) 0.9 % injection 3 mL (0 mLs Intravenous Hold 05/05/20 0257)  iohexol (OMNIPAQUE) 350 MG/ML injection 100 mL (100 mLs Intravenous Contrast Given 05/05/20 0429)  piperacillin-tazobactam (ZOSYN) IVPB 3.375 g (3.375 g Intravenous New Bag/Given 05/05/20 0546)     ED Discharge Orders    None      *Please note:  Zylah Elsbernd was evaluated in Emergency Department on 05/05/2020 for the symptoms described in the history of present illness. She was evaluated in the context of the global COVID-19 pandemic, which necessitated consideration that the patient might be at risk for infection with the SARS-CoV-2 virus that causes COVID-19. Institutional protocols and algorithms that pertain to the evaluation of patients at risk for COVID-19 are in a state of rapid change based on information released by regulatory bodies including the CDC and federal and state organizations. These policies and algorithms were followed during the  patient's care in the ED.  Some ED evaluations and interventions may be delayed as a result of limited staffing during and after the pandemic.*  Note:  This document was prepared using Dragon voice recognition software and may include unintentional dictation errors.   Darci Current, MD 05/05/20 [phone]

## 2020-05-05 NOTE — ED Notes (Signed)
MD in room to assess patient.  Will continue to monitor.   

## 2020-05-05 NOTE — ED Notes (Addendum)
Pt states coming in due abdominal pain that started yesterday morning. Pt denies nausea, vomiting and diarrhea.  Pt states she has not eaten since Wednesday and her last BM was Wednesday as well

## 2020-05-05 NOTE — H&P (Signed)
SURGICAL HISTORY AND PHYSICAL NOTE   HISTORY OF PRESENT ILLNESS (HPI):  54 y.o. female presented to [facility] ED for evaluation of abdominal pain since yesterday morning. Patient reports started with abdominal pain in the mid abdomen.  There was no specific pain radiation.  There was no alleviating or aggravating factor.  Due to the severity of the pain, fever and nausea the patient decided to come to the ED.  At the ED she had CBC showing leukocytosis of 18,000.  CT scan of the abdomen and pelvis shows diverticulitis of the sigmoid colon without perforation or abscess.  I personally evaluated the images.  There is no free fluid or free air.  There is fat stranding around the sigmoid colon with thickening of the sigmoid wall.  Patient denies rectal bleeding.  Surgery is consulted by Dr. Manson Passey in this context for evaluation and management of acute diverticulitis.  PAST MEDICAL HISTORY (PMH):  Past Medical History:  Diagnosis Date  . Arthritis    FEET  . Diabetes mellitus without complication (HCC)   . GERD (gastroesophageal reflux disease)   . History of hiatal hernia    SMALL PER PT  . HIV (human immunodeficiency virus infection) (HCC)   . Hypertension   . Hypothyroidism   . Overactive bladder   . Pneumonia 2007     PAST SURGICAL HISTORY [facility]):  Past Surgical History:  Procedure Laterality Date  . BIOPSY THYROID    . BREAST SURGERY     BIOPSY  . CARPAL TUNNEL RELEASE  2012  . CERVIX SURGERY  2013  . COLONOSCOPY  2014   polyps benign  . CRYOTHERAPY  2013-2014  . CYSTOSCOPY  10/30/2017   Procedure: CYSTOSCOPY;  Surgeon: Nadara Mustard, MD;  Location: [facility] ORS;  Service: Gynecology;;  . JOINT REPLACEMENT Left 2011   HIP  . LAPAROSCOPIC HYSTERECTOMY Bilateral 10/30/2017   Procedure: HYSTERECTOMY TOTAL LAPAROSCOPIC BILATERAL SALPINGECTOMY;  Surgeon: Nadara Mustard, MD;  Location: [facility] ORS;  Service: Gynecology;  Laterality: Bilateral;     MEDICATIONS:  Prior to Admission  medications   Medication Sig Start Date End Date Taking? Authorizing Provider  Acetaminophen-Codeine 300-15 MG TABS Take by mouth.    [provider]  DOVATO 50-300 MG TABS Take 1 tablet by mouth daily. 12/10/18   [provider]  famotidine (PEPCID) 40 MG tablet Take by mouth. 11/18/19 11/17/20  [provider]  levothyroxine (SYNTHROID, LEVOTHROID) 50 MCG tablet Take 50 mcg by mouth daily before breakfast.    [provider]  metFORMIN (GLUCOPHAGE) 500 MG tablet Take 0.5 tablets (250 mg total) by mouth 2 (two) times daily. 01/02/18   Carlean Jews, NP  metoprolol succinate (TOPROL-XL) 100 MG 24 hr tablet Take 100 mg by mouth daily. 12/29/19   [provider]  omeprazole (PRILOSEC) 40 MG capsule Take 40 mg by mouth daily. 02/08/20   [provider]  oxybutynin (DITROPAN-XL) 10 MG 24 hr tablet Take by mouth. 09/29/19 09/28/20  [provider]  rosuvastatin (CRESTOR) 10 MG tablet Take by mouth. 12/29/19   [provider]  RYBELSUS 7 MG TABS SMARTSIG:7 Milligram(s) By Mouth Daily 02/28/20   [provider]     ALLERGIES:  Allergies  Allergen Reactions  . Sulfa Antibiotics Other (See Comments)    Unknown     SOCIAL HISTORY:  Social History   Socioeconomic History  . Marital status: Single    Spouse name: Not on file  . Number of children: Not on file  .  Years of education: Not on file  . Highest education level: Not on file  Occupational History  . Not on file  Tobacco Use  . Smoking status: Former Smoker    Packs/day: 0.50    Years: 41.00    Pack years: 20.50    Types: Cigarettes    Start date: 12/30/1975    Quit date: 01/05/2017    Years since quitting: 3.3  . Smokeless tobacco: Never Used  Vaping Use  . Vaping Use: Former  Substance and Sexual Activity  . Alcohol use: Yes    Comment: Occasional  . Drug use: No    Types: "Crack" cocaine    Comment: PT STATES SHE HAS BEEN CLEAN FOR 6 YEARS NOW   .  Sexual activity: Not Currently    Birth control/protection: Post-menopausal  Other Topics Concern  . Not on file  Social History Narrative  . Not on file   Social Determinants of Health   Financial Resource Strain:   . Difficulty of Paying Living Expenses:   Food Insecurity:   . Worried About Programme researcher, broadcasting/film/video in the Last Year:   . Barista in the Last Year:   Transportation Needs:   . Freight forwarder (Medical):   Marland Kitchen Lack of Transportation (Non-Medical):   Physical Activity:   . Days of Exercise per Week:   . Minutes of Exercise per Session:   Stress:   . Feeling of Stress :   Social Connections:   . Frequency of Communication with Friends and Family:   . Frequency of Social Gatherings with Friends and Family:   . Attends Religious Services:   . Active Member of Clubs or Organizations:   . Attends Banker Meetings:   Marland Kitchen Marital Status:   Intimate Partner Violence:   . Fear of Current or Ex-Partner:   . Emotionally Abused:   Marland Kitchen Physically Abused:   . Sexually Abused:       FAMILY HISTORY:  Family History  Problem Relation Age of Onset  . Breast cancer Mother      REVIEW OF SYSTEMS:  Constitutional: denies weight loss, fever, chills, or sweats  Eyes: denies any other vision changes, history of eye injury  ENT: denies sore throat, hearing problems  Respiratory: denies shortness of breath, wheezing  Cardiovascular: denies chest pain, palpitations  Gastrointestinal: Positive abdominal pain, nausea. negative for vomiting Genitourinary: denies burning with urination or urinary frequency Musculoskeletal: denies any other joint pains or cramps  Skin: denies any other rashes or skin discolorations  Neurological: denies any other headache, dizziness, weakness  Psychiatric: denies any other depression, anxiety   All other review of systems were negative   VITAL SIGNS:  Temp:  [99.5 F (37.5 C)] 99.5 F (37.5 C) (08/05 1716) Pulse Rate:   [102-109] 102 (08/06 0517) Resp:  [18-20] 20 (08/06 0517) BP: (130-142)/(61-92) 142/92 (08/06 0517) SpO2:  [99 %-100 %] 99 % (08/06 0517) Weight:  [114.3 kg] 114.3 kg (08/05 1724)     Height: 5\' 7"  (170.2 cm) Weight: 114.3 kg BMI (Calculated): 39.46   INTAKE/OUTPUT:  This shift: No intake/output data recorded.  Last 2 shifts: @IOLAST2SHIFTS @   PHYSICAL EXAM:  Constitutional:  --Obese -- Awake, alert, and oriented x3  Eyes:  -- Pupils equally round and reactive to light  -- No scleral icterus  Ear, nose, and throat:  -- No jugular venous distension  Pulmonary:  -- No crackles  -- Equal breath sounds bilaterally -- Breathing non-labored  at rest Cardiovascular:  -- S1, S2 present  -- No pericardial rubs Gastrointestinal:  -- Abdomen soft, very mild tender, non-distended, no guarding or rebound tenderness -- No abdominal masses appreciated, pulsatile or otherwise  Musculoskeletal and Integumentary:  -- Wounds: None appreciated -- Extremities: B/L UE and LE FROM, hands and feet warm, no edema  Neurologic:  -- Motor function: intact and symmetric -- Sensation: intact and symmetric   Labs:  CBC Latest Ref Rng & Units 05/04/2020 05/13/2019 10/29/2017  WBC 4.0 - 10.5 K/uL 18.9(H) 8.1 7.9  Hemoglobin 12.0 - 15.0 g/dL 16.6(H) 15.3(H) 14.6  Hematocrit 36 - 46 % 49.9(H) 48.8(H) 44.2  Platelets 150 - 400 K/uL 256 226 229   CMP Latest Ref Rng & Units 05/04/2020 05/13/2019 01/28/2018  Glucose 70 - 99 mg/dL 409(W103(H) 119(J163(H) -  BUN 6 - 20 mg/dL 15 15 -  Creatinine 4.780.44 - 1.00 mg/dL 2.950.92 6.210.88 -  Sodium 308135 - 145 mmol/L 137 139 -  Potassium 3.5 - 5.1 mmol/L 3.9 3.7 -  Chloride 98 - 111 mmol/L 102 108 -  CO2 22 - 32 mmol/L 26 23 -  Calcium 8.9 - 10.3 mg/dL 9.4 9.0 -  Total Protein 6.5 - 8.1 g/dL 6.5(H9.6(H) 8.4(O8.6(H) 8.4  Total Bilirubin 0.3 - 1.2 mg/dL 9.6(E1.3(H) 0.8 0.4  Alkaline Phos 38 - 126 U/L 60 61 66  AST 15 - 41 U/L 20 31 14   ALT 0 - 44 U/L 21 38 19    Imaging studies:  EXAM: CT ABDOMEN AND  PELVIS WITH CONTRAST  TECHNIQUE: Multidetector CT imaging of the abdomen and pelvis was performed using the standard protocol following bolus administration of intravenous contrast.  CONTRAST:  100mL OMNIPAQUE IOHEXOL 350 MG/ML SOLN  COMPARISON:  None.  FINDINGS: Lower chest:  4 mm right lower lobe pulmonary nodule.  Hepatobiliary: No focal liver abnormality.No evidence of biliary obstruction or stone.  Pancreas: Unremarkable.  Spleen: Unremarkable.  Adrenals/Urinary Tract: Negative adrenals. No hydronephrosis or stone. Unremarkable bladder.  Stomach/Bowel: Sigmoid diverticulosis with inflammatory fat stranding around 1 of the sigmoid diverticula, with a few tiny bubbles of extraluminal gas. No abscess. No appendicitis.  Vascular/Lymphatic: No acute vascular abnormality. Scattered atheromatous calcification along the distal aorta-and iliacs. No mass or adenopathy.  Reproductive:Hysterectomy.  Other: No ascites or pneumoperitoneum.  Musculoskeletal: No acute abnormalities. Chronic avascular necrosis of the right femoral head. Left hip arthroplasty. Spinal degeneration, especially the lumbar facets.  IMPRESSION: 1. Sigmoid diverticulitis with a few bubbles of extraluminal gas. No drainable collection. 2. 4 mm right lower lobe pulmonary nodule. No follow-up needed if patient is low-risk. Non-contrast chest CT can be considered in 12 months if patient is high-risk. This recommendation follows the consensus statement: Guidelines for Management of Incidental Pulmonary Nodules Detected on CT Images: From the Fleischner Society 2017; Radiology 2017; 284:228-243.   Electronically Signed   By: Marnee SpringJonathon  Watts M.D.   On: 05/05/2020 05:15   Assessment/Plan:  54 y.o. female with acute diverticulitis, complicated by pertinent comorbidities including diabetes mellitus, hypertension, hypothyroidism, obesity.  Patient with first episode of acute  diverticulitis.  Currently under my evaluation the patient reports that he has significantly improved the pain.  The fact that she has elevated white blood cell count and an episode of fever before coming to the ED I recommend admission to the hospital for IV antibiotic therapy.  The patient was oriented about this recommendations.  I will start the patient on Zosyn and IV fluids for hydration.  Patient will be able  to take meds.  If the pain continues to be minimal will consider starting on clear liquids later today.  I encouraged the patient to ambulate.  We will start with Lovenox for DVT prophylaxis.  Gae Gallop, MD

## 2020-05-05 NOTE — Progress Notes (Signed)
SURGICAL FOLLOW UP NOTE.  Patient reevaluated afternoon.  She continue without abdominal pain.  She has been ambulating to the bathroom without pain.  She reports passing gas.  She denies any fever.  There is no nausea.  Vitals:   05/05/20 0517 05/05/20 0908  BP: (!) 142/92 137/85  Pulse: (!) 102 100  Resp: 20 18  Temp:    SpO2: 99% 96%   General: Alert, oriented x3 Abdomen: Soft and depressible, nontender.  Assessment and plan  Patient responded well to IV antibiotic therapy.  We will start clear liquid diet.  I encouraged the patient to ambulate.  We will repeat labs in the a.m. for assessment of white blood cell count trend.  We will continue with IV antibiotic therapy.  This follow-up encounter was more than 30-minute most of the time counseling the patient and coordinating plan of care.  Gae Gallop, MD

## 2020-05-06 ENCOUNTER — Other Ambulatory Visit: Payer: Self-pay

## 2020-05-06 LAB — GLUCOSE, CAPILLARY
Glucose-Capillary: 81 mg/dL (ref 70–99)
Glucose-Capillary: 94 mg/dL (ref 70–99)
Glucose-Capillary: 136 mg/dL — ABNORMAL HIGH (ref 70–99)
Glucose-Capillary: 72 mg/dL (ref 70–99)
Glucose-Capillary: 110 mg/dL — ABNORMAL HIGH (ref 70–99)

## 2020-05-06 LAB — CBC
HCT: 43.1 % (ref 36.0–46.0)
Hemoglobin: 14.5 g/dL (ref 12.0–15.0)
MCH: 28.2 pg (ref 26.0–34.0)
MCHC: 33.6 g/dL (ref 30.0–36.0)
MCV: 83.7 fL (ref 80.0–100.0)
Platelets: 227 10*3/uL (ref 150–400)
RBC: 5.15 MIL/uL — ABNORMAL HIGH (ref 3.87–5.11)
RDW: 15.1 % (ref 11.5–15.5)
WBC: 17.8 10*3/uL — ABNORMAL HIGH (ref 4.0–10.5)
nRBC: 0 % (ref 0.0–0.2)

## 2020-05-06 MED ORDER — DOLUTEGRAVIR SODIUM 50 MG PO TABS
50.0000 mg | ORAL_TABLET | Freq: Every day | ORAL | Status: DC
  Administered 2020-05-07: 50 mg via ORAL
  Filled 2020-05-06 (×2): qty 1

## 2020-05-06 MED ORDER — LAMIVUDINE 150 MG PO TABS
300.0000 mg | ORAL_TABLET | Freq: Every day | ORAL | Status: DC
  Administered 2020-05-07: 300 mg via ORAL
  Filled 2020-05-06 (×3): qty 2

## 2020-05-06 NOTE — Progress Notes (Signed)
SURGICAL PROGRESS NOTE   Hospital Day(s): 1.   Post op day(s):  Marland Kitchen   Interval History: Patient seen and examined, no acute events or new complaints overnight. Patient reports continue with abdominal pain.  There is mild soreness in the suprapubic area.  The pain does not radiate to the department body.  There is no alleviating or rating factor.  The patient reported that she ate liquid liquids without any abdominal pain.  Denies nausea or vomiting.  Denies fever or chills.  Vital signs in last 24 hours: [min-max] current  Temp:  [98.1 F (36.7 C)-99.6 F (37.6 C)] 98.1 F (36.7 C) (08/07 0847) Pulse Rate:  [91-102] 98 (08/07 0847) Resp:  [16-19] 16 (08/07 0847) BP: (115-136)/(70-85) 115/79 (08/07 0847) SpO2:  [97 %-100 %] 98 % (08/07 0847)     Height: 5\' 7"  (170.2 cm) Weight: 114.3 kg BMI (Calculated): 39.46   Physical Exam:  Constitutional: alert, cooperative and no distress  Respiratory: breathing non-labored at rest  Cardiovascular: regular rate and sinus rhythm  Gastrointestinal: soft, non-tender, and non-distended  Labs:  CBC Latest Ref Rng & Units 05/06/2020 05/04/2020 05/13/2019  WBC 4.0 - 10.5 K/uL 17.8(H) 18.9(H) 8.1  Hemoglobin 12.0 - 15.0 g/dL 05/15/2019 16.6(H) 15.3(H)  Hematocrit 36 - 46 % 43.1 49.9(H) 48.8(H)  Platelets 150 - 400 K/uL 227 256 226   CMP Latest Ref Rng & Units 05/04/2020 05/13/2019 01/28/2018  Glucose 70 - 99 mg/dL 03/30/2018) 818(E) -  BUN 6 - 20 mg/dL 15 15 -  Creatinine 993(Z - 1.00 mg/dL 1.69 6.78 -  Sodium 9.38 - 145 mmol/L 137 139 -  Potassium 3.5 - 5.1 mmol/L 3.9 3.7 -  Chloride 98 - 111 mmol/L 102 108 -  CO2 22 - 32 mmol/L 26 23 -  Calcium 8.9 - 10.3 mg/dL 9.4 9.0 -  Total Protein 6.5 - 8.1 g/dL 101) 7.5(Z) 8.4  Total Bilirubin 0.3 - 1.2 mg/dL 0.2(H) 0.8 0.4  Alkaline Phos 38 - 126 U/L 60 61 66  AST 15 - 41 U/L 20 31 14   ALT 0 - 44 U/L 21 38 19    Imaging studies: No new pertinent imaging studies   Assessment/Plan:  54 y.o. female with acute  diverticulitis, complicated by pertinent comorbidities including diabetes mellitus, hypertension, hypothyroidism, obesity.  Patient without any clinical deterioration.  No fever.  No worsening abdominal pain.  The pain has been minimal on the suprapubic area.  Clear liquid did not aggravate the pain.  I will advance her diet to full liquids.  The white blood cells stay elevated.  We will continue with IV antibiotic therapy.  Patient ambulating.  We will continue with DVT prophylaxis.  , MD

## 2020-05-07 LAB — CBC
HCT: 40.8 % (ref 36.0–46.0)
Hemoglobin: 13.6 g/dL (ref 12.0–15.0)
MCH: 28.4 pg (ref 26.0–34.0)
MCHC: 33.3 g/dL (ref 30.0–36.0)
MCV: 85.2 fL (ref 80.0–100.0)
Platelets: 222 10*3/uL (ref 150–400)
RBC: 4.79 MIL/uL (ref 3.87–5.11)
RDW: 15 % (ref 11.5–15.5)
WBC: 12.4 10*3/uL — ABNORMAL HIGH (ref 4.0–10.5)
nRBC: 0 % (ref 0.0–0.2)

## 2020-05-07 LAB — GLUCOSE, CAPILLARY
Glucose-Capillary: 87 mg/dL (ref 70–99)
Glucose-Capillary: 89 mg/dL (ref 70–99)
Glucose-Capillary: 91 mg/dL (ref 70–99)

## 2020-05-07 MED ORDER — INSULIN ASPART 100 UNIT/ML ~~LOC~~ SOLN: 0.0000 [IU] | Freq: Three times a day (TID) | SUBCUTANEOUS | Status: DC

## 2020-05-07 NOTE — Progress Notes (Signed)
SURGICAL PROGRESS NOTE   Hospital Day(s): 2.   Post op day(s):  Marland Kitchen   Interval History: Patient seen and examined, no acute events or new complaints overnight. Patient reports feeling well.  There is no worsening abdominal pain.  Minimal discomfort in the suprapubic area.  There is no radiation.  There is no alleviating or aggravating factor.  Patient denies any fevers.  Patient reported tolerating full liquids without worsening pain.  Vital signs in last 24 hours: [min-max] current  Temp:  [98.1 F (36.7 C)-99.5 F (37.5 C)] 98.5 F (36.9 C) (08/08 0755) Pulse Rate:  [84-93] 84 (08/08 0755) Resp:  [16-18] 16 (08/08 0755) BP: (121)/(67-79) 121/76 (08/08 0755) SpO2:  [98 %-100 %] 98 % (08/08 0755)     Height: 5\' 7"  (170.2 cm) Weight: 114.3 kg BMI (Calculated): 39.46   Physical Exam:  Constitutional: alert, cooperative and no distress  Respiratory: breathing non-labored at rest  Cardiovascular: regular rate and sinus rhythm  Gastrointestinal: soft, non-tender, and non-distended  Labs:  CBC Latest Ref Rng & Units 05/07/2020 05/06/2020 05/04/2020  WBC 4.0 - 10.5 K/uL 12.4(H) 17.8(H) 18.9(H)  Hemoglobin 12.0 - 15.0 g/dL 07/04/2020 65.7 16.6(H)  Hematocrit 36 - 46 % 40.8 43.1 49.9(H)  Platelets 150 - 400 K/uL 222 227 256   CMP Latest Ref Rng & Units 05/04/2020 05/13/2019 01/28/2018  Glucose 70 - 99 mg/dL 03/30/2018) 962(X) -  BUN 6 - 20 mg/dL 15 15 -  Creatinine 528(U - 1.00 mg/dL 1.32 4.40 -  Sodium 1.02 - 145 mmol/L 137 139 -  Potassium 3.5 - 5.1 mmol/L 3.9 3.7 -  Chloride 98 - 111 mmol/L 102 108 -  CO2 22 - 32 mmol/L 26 23 -  Calcium 8.9 - 10.3 mg/dL 9.4 9.0 -  Total Protein 6.5 - 8.1 g/dL 725) 3.6(U) 8.4  Total Bilirubin 0.3 - 1.2 mg/dL 4.4(I) 0.8 0.4  Alkaline Phos 38 - 126 U/L 60 61 66  AST 15 - 41 U/L 20 31 14   ALT 0 - 44 U/L 21 38 19    Imaging studies: No new pertinent imaging studies   Assessment/Plan:  54 y.o.femalewith acute diverticulitis, complicated by pertinent  comorbidities includingdiabetes mellitus, hypertension, hypothyroidism, obesity.  Patient recovering slowly.  There has been no fever.  White blood cells decreasing to 12,000.  This is a great decreasing trend that is consistent with her improvement in the abdominal pain.  Patient tolerating diet, full liquids.  Will advance to soft diet.  Patient refusing CBG checks.  Patient also refusing further blood work-up.  My recommendation was to follow blood cell count down to normal levels but she is refusing to have any more blood work.  Agree to follow her clinically.  There is no more fever and she tolerates some diet she may be able to be discharged with transfusion and to oral antibiotic therapy possibly tomorrow.  Patient encouraged to ambulate.  Patient also refusing Lovenox because she is ambulating.  We will continue with IV antibiotic therapy and pain medication as needed.  , MD

## 2020-05-08 MED ORDER — AMOXICILLIN-POT CLAVULANATE 875-125 MG PO TABS: 1.0000 | ORAL_TABLET | Freq: Two times a day (BID) | ORAL | 0 refills | Status: AC

## 2020-05-08 NOTE — Care Management Important Message (Signed)
Important Message  Patient Details  Name: Misty Larsen MRN: [id number] Date of Birth: 1965-11-29   Medicare Important Message Given:  Yes     Enmanuel Zufall, Stephan Minister 05/08/2020, 1:25 PM

## 2020-05-08 NOTE — Progress Notes (Signed)
Spoke with MD, stated to resume all home medications and patient to call office for formal letter to fax to employer.

## 2020-05-08 NOTE — Discharge Summary (Addendum)
  Patient ID: Misty Larsen MRN: [id number] DOB/AGE: 1965/11/12 54 y.o.  Admit date: 05/05/2020 Discharge date: 05/08/2020   Discharge Diagnoses:  Active Problems:   Diverticulitis large intestine w/o perforation or abscess w/o bleeding   Procedures: None  Hospital Course: Patient was admitted with acute diverticulitis.  Patient was treated with IV antibiotic therapy, bowel rest and IV hydration.  Her pain resolved.  The white blood cell decrease from 18,000-12,000.  The patient has not had any fever.  The patient tolerated a soft diet.  Today the patient without abdominal pain.  She is having bowel movement and passing gas.  Physical Exam Cardiovascular:     Rate and Rhythm: Normal rate and regular rhythm.  Pulmonary:     Effort: Pulmonary effort is normal.     Breath sounds: Normal breath sounds.  Abdominal:     General: Abdomen is protuberant. Bowel sounds are normal. There is no distension.     Tenderness: There is no abdominal tenderness.  Neurological:     Mental Status: She is alert and oriented to person, place, and time.      Consults: None  Disposition: Discharge disposition: 01-Home or Self Care       Discharge Instructions    Lifting restrictions   Complete by: As directed    Patient cannot lift more than 20 pounds for one week.     Allergies as of 05/08/2020      Reactions   Sulfa Antibiotics Other (See Comments)   Unknown      Medication List    TAKE these medications   acetaminophen 650 MG CR tablet Commonly known as: TYLENOL Take 650 mg by mouth every 8 (eight) hours as needed for pain.   amoxicillin-clavulanate 875-125 MG tablet Commonly known as: Augmentin Take 1 tablet by mouth 2 (two) times daily for 10 days.   Dovato 50-300 MG Tabs Generic drug: Dolutegravir-lamiVUDine Take 1 tablet by mouth daily.   famotidine 40 MG tablet Commonly known as: PEPCID Take by mouth daily as needed.   levothyroxine 50 MCG tablet Commonly known as:  SYNTHROID Take 50 mcg by mouth daily before breakfast.   metFORMIN 500 MG tablet Commonly known as: GLUCOPHAGE Take 0.5 tablets (250 mg total) by mouth 2 (two) times daily. What changed:   how much to take  when to take this   metoprolol succinate 100 MG 24 hr tablet Commonly known as: TOPROL-XL Take 100 mg by mouth daily.   omeprazole 40 MG capsule Commonly known as: PRILOSEC Take 40 mg by mouth daily.   oxybutynin 10 MG 24 hr tablet Commonly known as: DITROPAN-XL Take by mouth.   pimecrolimus 1 % cream Commonly known as: ELIDEL Apply 1 application topically 2 (two) times daily.   rosuvastatin 10 MG tablet Commonly known as: CRESTOR Take by mouth.   Rybelsus 7 MG Tabs Generic drug: Semaglutide SMARTSIG:7 Milligram(s) By Mouth Daily   triamcinolone ointment 0.1 % Commonly known as: KENALOG Apply 1 application topically as directed.       Follow-up Information    Carolan Shiver, MD Follow up in 2 week(s).   Specialty: General Surgery Why: Follow up after discharge due to Diverticulitis Contact information: [address] [place] [address] [phone]              This discharge encounter was more than 30-minute most of the time counseling the patient and coordinating plan of care.

## 2020-05-08 NOTE — Discharge Instructions (Signed)
  Diet: Resume home heart healthy low fiber diet.   Activity: No heavy lifting >20 pounds (children, pets, laundry, garbage) or strenuous activity until follow-up, but light activity and walking are encouraged.   May not return to work for one week from today 05/08/20.   Medications: Resume all home medications. For mild to moderate pain: acetaminophen (Tylenol) or ibuprofen (if no kidney disease). Combining Tylenol with alcohol can substantially increase your risk of causing liver disease.    Take antibiotic therapy as prescribed.  Call office [phone]) at any time if any questions, worsening pain, fevers/chills, bleeding, drainage from incision site, or other concerns.

## 2020-06-01 DIAGNOSIS — R911 Solitary pulmonary nodule: Secondary | ICD-10-CM | POA: Insufficient documentation

## 2020-09-30 ENCOUNTER — Encounter: Payer: Self-pay | Admitting: Emergency Medicine

## 2020-09-30 ENCOUNTER — Emergency Department
Admission: EM | Admit: 2020-09-30 | Discharge: 2020-09-30 | Disposition: A | Discharge status: Home / Self Care | Payer: Medicare Other | Attending: Emergency Medicine | Admitting: Emergency Medicine

## 2020-09-30 ENCOUNTER — Other Ambulatory Visit: Payer: Self-pay

## 2020-09-30 DIAGNOSIS — Z5321 Procedure and treatment not carried out due to patient leaving prior to being seen by health care provider: Secondary | ICD-10-CM | POA: Diagnosis not present

## 2020-09-30 DIAGNOSIS — R000 Tachycardia, unspecified: Secondary | ICD-10-CM | POA: Insufficient documentation

## 2020-09-30 DIAGNOSIS — I10 Essential (primary) hypertension: Secondary | ICD-10-CM | POA: Insufficient documentation

## 2020-09-30 LAB — BASIC METABOLIC PANEL
Anion gap: 11 (ref 5–15)
BUN: 16 mg/dL (ref 6–20)
CO2: 24 mmol/L (ref 22–32)
Calcium: 9 mg/dL (ref 8.9–10.3)
Chloride: 104 mmol/L (ref 98–111)
Creatinine, Ser: 0.98 mg/dL (ref 0.44–1.00)
GFR, Estimated: >60 mL/min (ref >60)
Glucose, Bld: 86 mg/dL (ref 70–99)
Potassium: 4.1 mmol/L (ref 3.5–5.1)
Sodium: 139 mmol/L (ref 135–145)

## 2020-09-30 LAB — CBC
HCT: 47.3 % — ABNORMAL HIGH (ref 36.0–46.0)
Hemoglobin: 15.2 g/dL — ABNORMAL HIGH (ref 12.0–15.0)
MCH: 27.6 pg (ref 26.0–34.0)
MCHC: 32.1 g/dL (ref 30.0–36.0)
MCV: 86 fL (ref 80.0–100.0)
Platelets: 271 10*3/uL (ref 150–400)
RBC: 5.5 MIL/uL — ABNORMAL HIGH (ref 3.87–5.11)
RDW: 14.4 % (ref 11.5–15.5)
WBC: 8.4 10*3/uL (ref 4.0–10.5)
nRBC: 0 % (ref 0.0–0.2)

## 2020-09-30 LAB — TROPONIN I (HIGH SENSITIVITY): Troponin I (High Sensitivity): <2 ng/L (ref <18)

## 2020-09-30 NOTE — ED Triage Notes (Signed)
Pt arrived via POV with reports of feeling her heart beating fast and hearing her heart beat. Pt states she has hx of swimmers ear on the L.  Pt also feels like her BP is high but did not check.  Pt took BP meds today.

## 2020-09-30 NOTE — ED Triage Notes (Signed)
First RN Note: pt to ED via POV with c/o feeling her heart beat today. Pt states was sitting at home and states she could feel her heartbeat, also states she feels like her BP may be high.

## 2021-04-24 IMAGING — CT CT ABD-PELV W/ CM
2 of 5 series · 17 of 46 positions shown, 19 images · IV contrast (APPLIED)
Comparison: None.

CLINICAL DATA: Left lower quadrant pain and fever

EXAM:
CT ABDOMEN AND PELVIS WITH CONTRAST
TECHNIQUE: Multidetector CT imaging of the abdomen and pelvis was performed
using the standard protocol following bolus administration of
intravenous contrast.
CONTRAST:  100mL OMNIPAQUE IOHEXOL 350 MG/ML SOLN

[Series 2: routine abd/pel with · axial · 0.86mm/px · z∈[-870,-465]mm · 14 of 93 slices shown, 16 images]
[im 6/93  soft-tissue]
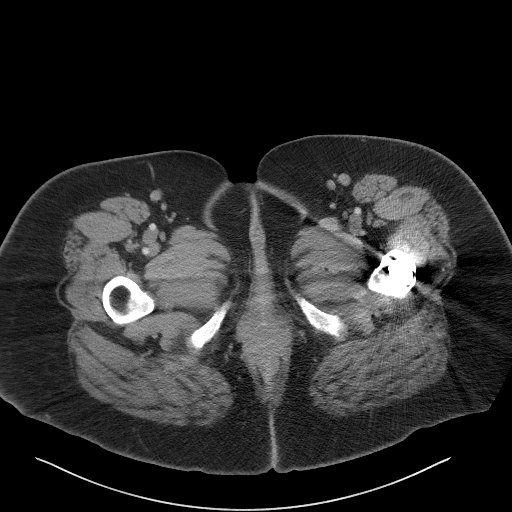
[im 6/93  bone]
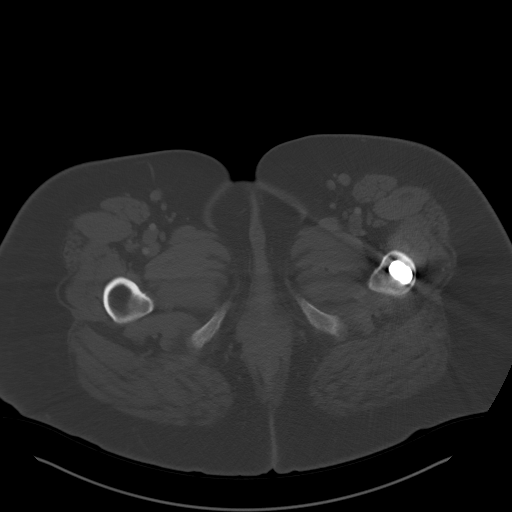
[im 11/93  soft-tissue]
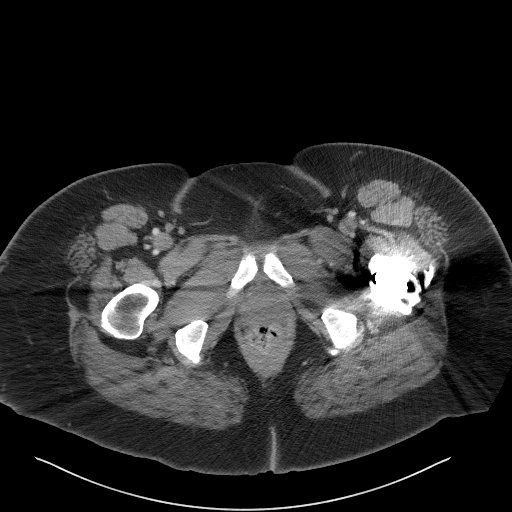
[im 21/93  soft-tissue]
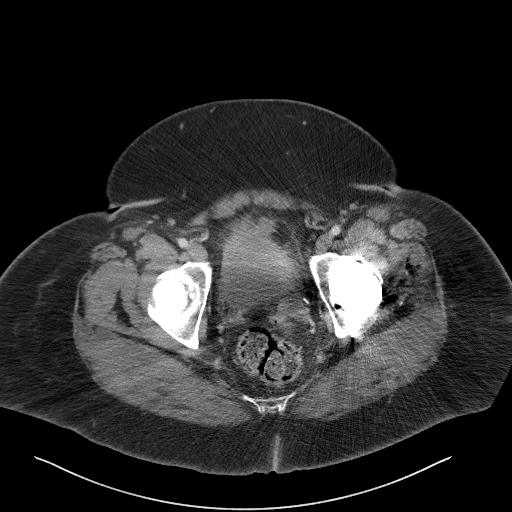
[im 26/93  soft-tissue]
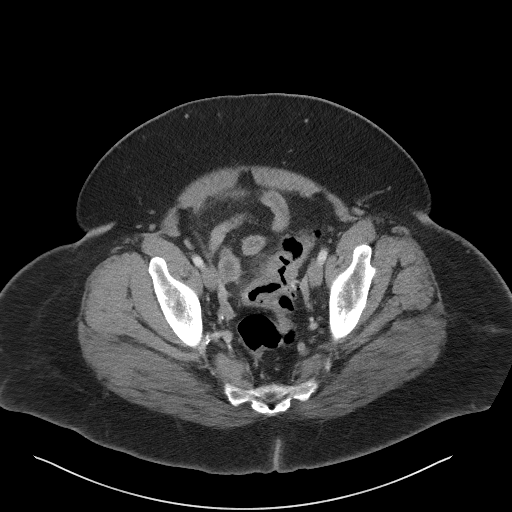
[im 31/93  soft-tissue]
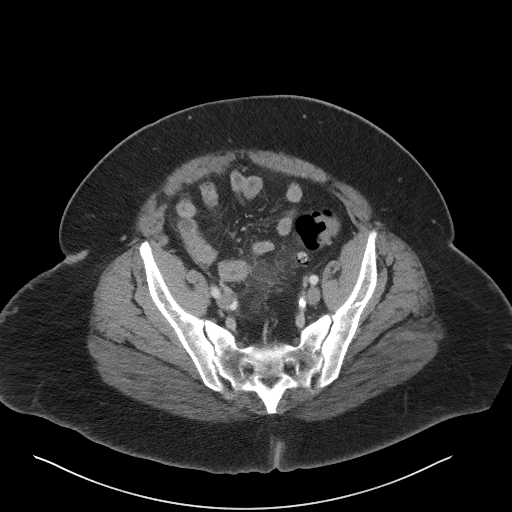
[im 36/93  soft-tissue]
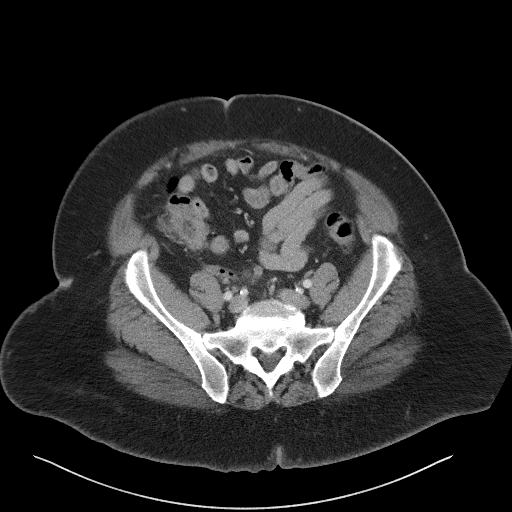
[im 41/93  soft-tissue]
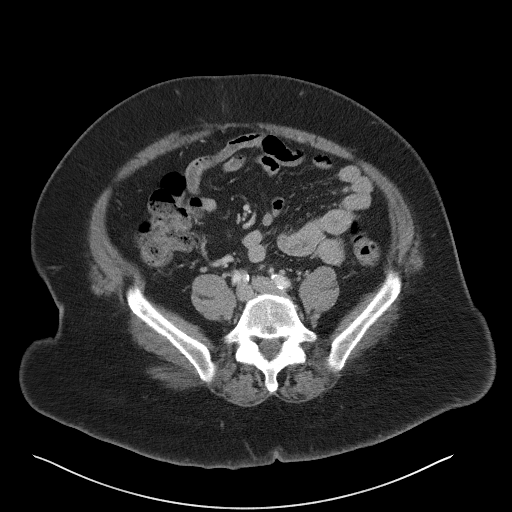
[im 52/93  soft-tissue]
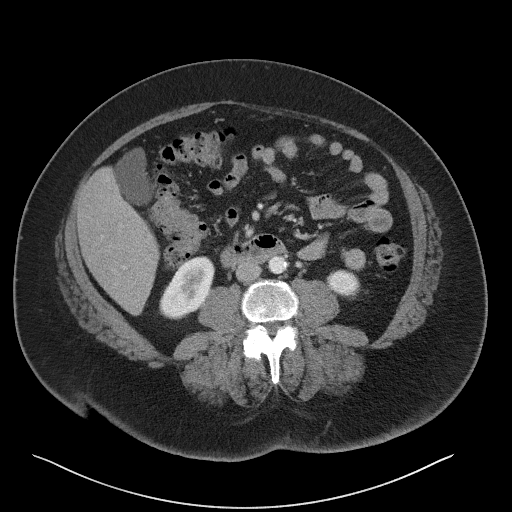
[im 57/93  soft-tissue]
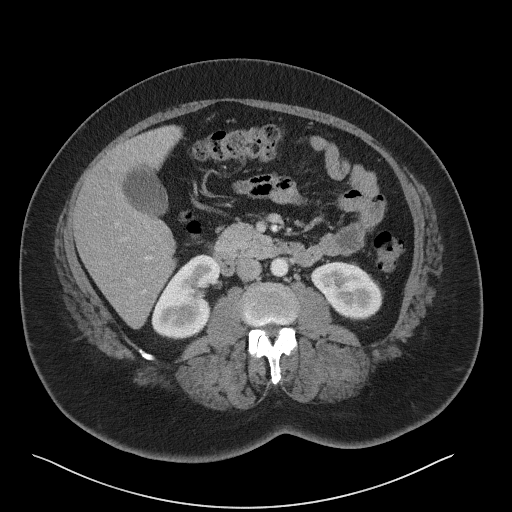
[im 57/93  bone]
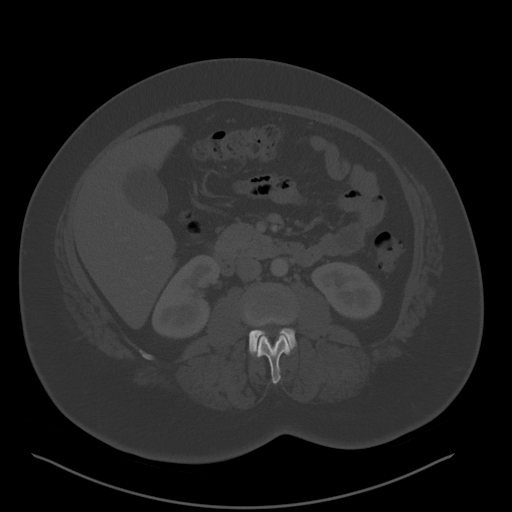
[im 62/93  soft-tissue]
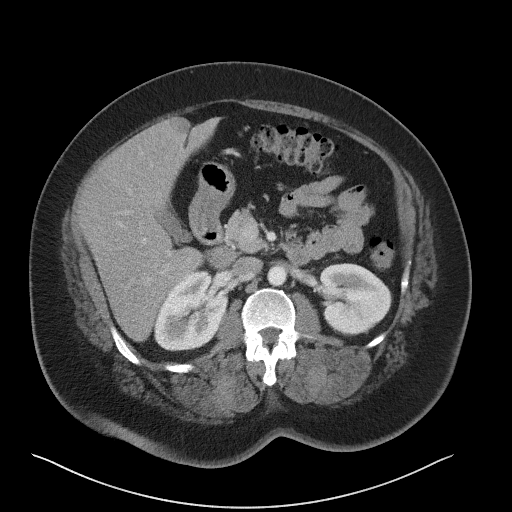
[im 67/93  soft-tissue]
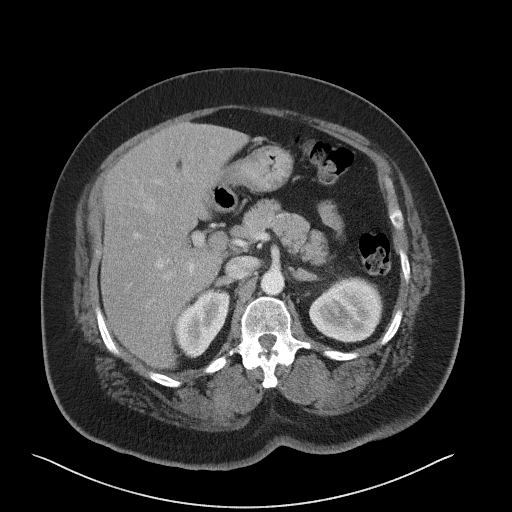
[im 72/93  soft-tissue]
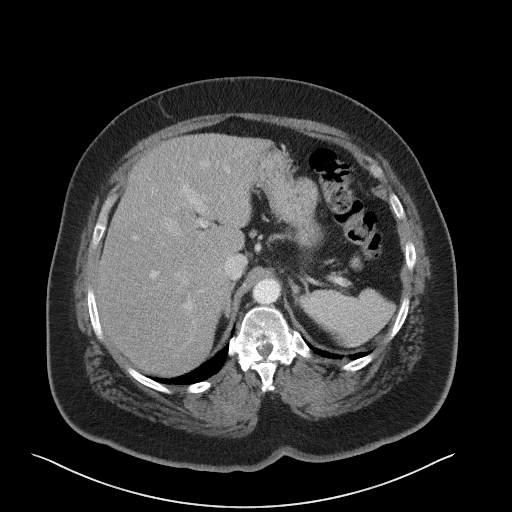
[im 82/93  soft-tissue]
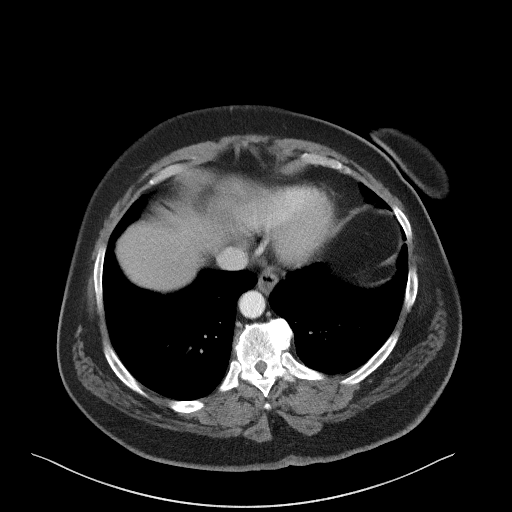
[im 87/93  soft-tissue]
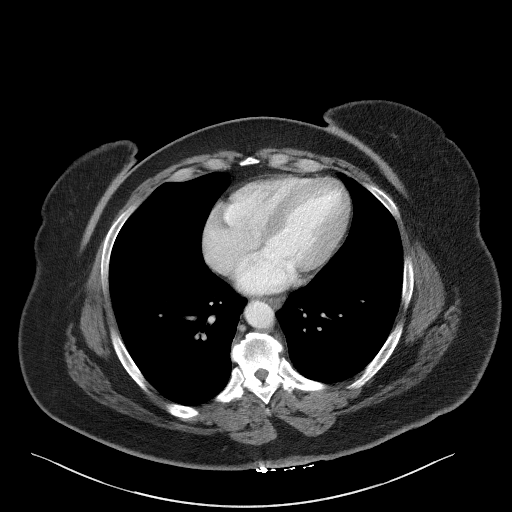

[Series 5: coronal st · coronal · 0.88mm/px · 3 of 121 slices shown]
[im 41/121  soft-tissue]
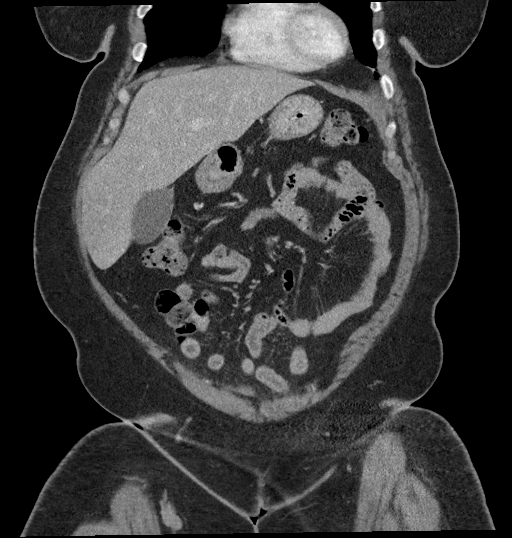
[im 54/121  soft-tissue]
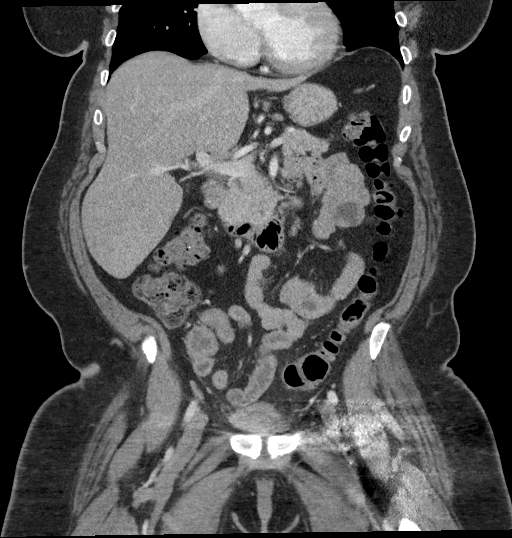
[im 67/121  soft-tissue]
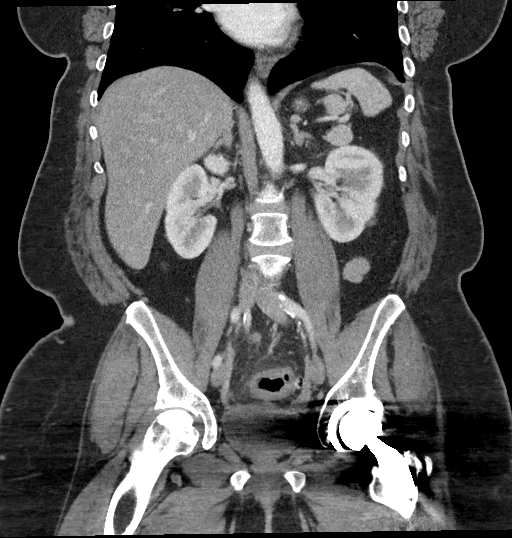

[17 of 46 positions shown; findings below may reference images not displayed]

FINDINGS: Lower chest:  4 mm right lower lobe pulmonary nodule.

Hepatobiliary: No focal liver abnormality.No evidence of biliary
obstruction or stone.

Pancreas: Unremarkable.

Spleen: Unremarkable.

Adrenals/Urinary Tract: Negative adrenals. No hydronephrosis or
stone. Unremarkable bladder.

Stomach/Bowel: Sigmoid diverticulosis with inflammatory fat
stranding around 1 of the sigmoid diverticula, with a few tiny
bubbles of extraluminal gas. No abscess. No appendicitis.

Vascular/Lymphatic: No acute vascular abnormality. Scattered
atheromatous calcification along the distal aorta-and iliacs. No
mass or adenopathy.

Reproductive:Hysterectomy.

Other: No ascites or pneumoperitoneum.

Musculoskeletal: No acute abnormalities. Chronic avascular necrosis
of the right femoral head. Left hip arthroplasty. Spinal
degeneration, especially the lumbar facets.
IMPRESSION: 1. Sigmoid diverticulitis with a few bubbles of extraluminal gas. No
drainable collection.
2. 4 mm right lower lobe pulmonary nodule. No follow-up needed if
patient is low-risk. Non-contrast chest CT can be considered in 12
months if patient is high-risk. This recommendation follows the
consensus statement: Guidelines for Management of Incidental
Pulmonary Nodules Detected on CT Images: From the [HOSPITAL]

## 2021-05-18 ENCOUNTER — Ambulatory Visit (INDEPENDENT_AMBULATORY_CARE_PROVIDER_SITE_OTHER): Payer: Medicare Other | Admitting: Podiatry

## 2021-05-18 ENCOUNTER — Other Ambulatory Visit: Payer: Self-pay

## 2021-05-18 DIAGNOSIS — M19079 Primary osteoarthritis, unspecified ankle and foot: Secondary | ICD-10-CM

## 2021-05-18 DIAGNOSIS — M79676 Pain in unspecified toe(s): Secondary | ICD-10-CM

## 2021-05-18 DIAGNOSIS — E0843 Diabetes mellitus due to underlying condition with diabetic autonomic (poly)neuropathy: Secondary | ICD-10-CM

## 2021-05-18 DIAGNOSIS — B351 Tinea unguium: Secondary | ICD-10-CM

## 2021-05-18 NOTE — Progress Notes (Signed)
   SUBJECTIVE Patient with a history of diabetes mellitus presents to office today complaining of elongated, thickened nails that cause pain while ambulating in shoes.  Patient is unable to trim their own nails. Patient is here for further evaluation and treatment.   Past Medical History:  Diagnosis Date   Arthritis    FEET   Diabetes mellitus without complication (HCC)    GERD (gastroesophageal reflux disease)    History of hiatal hernia    SMALL PER PT   HIV (human immunodeficiency virus infection) (HCC)    Hypertension    Hypothyroidism    Overactive bladder    Pneumonia 2007    OBJECTIVE General Patient is awake, alert, and oriented x 3 and in no acute distress. Derm Skin is dry and supple bilateral. Negative open lesions or macerations. Remaining integument unremarkable. Nails are tender, long, thickened and dystrophic with subungual debris, consistent with onychomycosis, 1-5 bilateral. No signs of infection noted. Vasc  DP and PT pedal pulses palpable bilaterally. Temperature gradient within normal limits.  Neuro Epicritic and protective threshold sensation diminished bilaterally.  Musculoskeletal Exam No symptomatic pedal deformities noted bilateral. Muscular strength within normal limits.  ASSESSMENT 1. Diabetes Mellitus w/ peripheral neuropathy 2.  Pain due to onychomycosis of toenails bilateral  PLAN OF CARE 1. Patient evaluated today. 2. Instructed to maintain good pedal hygiene and foot care. Stressed importance of controlling blood sugar.  3. Mechanical debridement of nails 1-5 bilaterally performed using a nail nipper. Filed with dremel without incident.  4. Return to clinic in 3 mos.     Felecia Shelling, DPM [facility]  Dr. Felecia Shelling, DPM    [address] [place], [place] [address]                Office [phone]  Fax [phone]

## 2022-04-26 ENCOUNTER — Ambulatory Visit (INDEPENDENT_AMBULATORY_CARE_PROVIDER_SITE_OTHER): Payer: Medicare Other | Admitting: Podiatry

## 2022-04-26 DIAGNOSIS — E119 Type 2 diabetes mellitus without complications: Secondary | ICD-10-CM | POA: Diagnosis not present

## 2022-04-26 DIAGNOSIS — B351 Tinea unguium: Secondary | ICD-10-CM | POA: Diagnosis not present

## 2022-04-26 DIAGNOSIS — M79674 Pain in right toe(s): Secondary | ICD-10-CM | POA: Diagnosis not present

## 2022-04-26 DIAGNOSIS — M79675 Pain in left toe(s): Secondary | ICD-10-CM

## 2022-04-26 NOTE — Progress Notes (Signed)
   SUBJECTIVE Patient with a history of diabetes mellitus presents to office today complaining of elongated, thickened nails that cause pain while ambulating in shoes.  Patient is unable to trim their own nails. Patient is here for further evaluation and treatment.   Past Medical History:  Diagnosis Date   Arthritis    FEET   Diabetes mellitus without complication (HCC)    GERD (gastroesophageal reflux disease)    History of hiatal hernia    SMALL PER PT   HIV (human immunodeficiency virus infection) (HCC)    Hypertension    Hypothyroidism    Overactive bladder    Pneumonia 2007    OBJECTIVE General Patient is awake, alert, and oriented x 3 and in no acute distress. Derm Skin is dry and supple bilateral. Negative open lesions or macerations. Remaining integument unremarkable. Nails are tender, long, thickened and dystrophic with subungual debris, consistent with onychomycosis, 1-5 bilateral. No signs of infection noted. Vasc  DP and PT pedal pulses palpable bilaterally. Temperature gradient within normal limits.  Neuro Epicritic and protective threshold sensation diminished bilaterally.  Musculoskeletal Exam No symptomatic pedal deformities noted bilateral. Muscular strength within normal limits.  ASSESSMENT 1. Diabetes Mellitus w/ peripheral neuropathy 2.  Pain due to onychomycosis of toenails bilateral  PLAN OF CARE 1. Patient evaluated today.  Comprehensive diabetic foot exam performed today 2. Instructed to maintain good pedal hygiene and foot care. Stressed importance of controlling blood sugar.  3. Mechanical debridement of nails 1-5 bilaterally performed using a nail nipper. Filed with dremel without incident.  4. Return to clinic in 3 mos.     Felecia Shelling, DPM [facility]  Dr. Felecia Shelling, DPM    [address] [place], [place] [address]                Office [phone]  Fax [phone]

## 2023-01-21 ENCOUNTER — Ambulatory Visit: Payer: 59 | Admitting: Podiatry

## 2023-03-04 ENCOUNTER — Ambulatory Visit: Payer: 59 | Admitting: Podiatry

## 2023-03-07 ENCOUNTER — Ambulatory Visit (INDEPENDENT_AMBULATORY_CARE_PROVIDER_SITE_OTHER): Payer: 59 | Admitting: Podiatry

## 2023-03-07 DIAGNOSIS — B351 Tinea unguium: Secondary | ICD-10-CM

## 2023-03-07 DIAGNOSIS — M79675 Pain in left toe(s): Secondary | ICD-10-CM

## 2023-03-07 DIAGNOSIS — M79674 Pain in right toe(s): Secondary | ICD-10-CM

## 2023-03-07 MED ORDER — TERBINAFINE HCL 250 MG PO TABS: 250.0000 mg | ORAL_TABLET | Freq: Every day | ORAL | 0 refills | Status: DC

## 2023-03-07 NOTE — Progress Notes (Signed)
   Chief Complaint  Patient presents with   Nail Problem    Thick painful toenails, annual follow up    SUBJECTIVE Patient with a history of diabetes mellitus presents to office today complaining of elongated, thickened nails that cause pain while ambulating in shoes.  Patient is unable to trim their own nails.  Patient would also like to discuss different treatment options for her fungal nail infections.  She has tried different topical medications in the past with no improvement.  Patient is here for further evaluation and treatment.   Past Medical History:  Diagnosis Date   Arthritis    FEET   Diabetes mellitus without complication (HCC)    GERD (gastroesophageal reflux disease)    History of hiatal hernia    SMALL PER PT   HIV (human immunodeficiency virus infection) (HCC)    Hypertension    Hypothyroidism    Overactive bladder    Pneumonia 2007    OBJECTIVE General Patient is awake, alert, and oriented x 3 and in no acute distress. Derm Skin is dry and supple bilateral. Negative open lesions or macerations. Remaining integument unremarkable. Nails are tender, long, thickened and dystrophic with subungual debris, consistent with onychomycosis, 1-5 bilateral. No signs of infection noted. Vasc  DP and PT pedal pulses palpable bilaterally. Temperature gradient within normal limits.  Neuro Epicritic and protective threshold sensation diminished bilaterally.  Musculoskeletal Exam No symptomatic pedal deformities noted bilateral. Muscular strength within normal limits.  ASSESSMENT 1. Diabetes Mellitus w/ peripheral neuropathy 2.  Pain due to onychomycosis of toenails bilateral  PLAN OF CARE -Patient evaluated today.  Comprehensive diabetic foot exam performed today -Instructed to maintain good pedal hygiene and foot care. Stressed importance of controlling blood sugar.  -Mechanical debridement of nails 1-5 bilaterally performed using a nail nipper. Filed with dremel without  incident.  -Today we discussed different treatment options for the thickening and discoloration of the fungal nail infection to the left hallux nail plate.  Discussed the relative efficacies and risks and benefits associated to each modality including oral, topical, and laser antifungal treatment modalities.  The patient opts for oral antifungal.  Denies a history of liver pathology or symptoms.  CMP on 12/16/2022 hepatic function WNL.   -Prescription for Lamisil 2 and 50 mg #90 daily  -return to clinic in 3 mos. for routine foot care  *works at [employer], DPM [facility]  Dr. Felecia Shelling, DPM    [address] [place], [place] [address]                Office [phone]  Fax [phone]

## 2023-06-10 ENCOUNTER — Ambulatory Visit: Payer: 59 | Admitting: Podiatry

## 2023-06-12 ENCOUNTER — Other Ambulatory Visit: Payer: Self-pay | Admitting: Podiatry

## 2023-07-25 ENCOUNTER — Encounter: Payer: Self-pay | Admitting: Podiatry

## 2023-07-25 ENCOUNTER — Ambulatory Visit (INDEPENDENT_AMBULATORY_CARE_PROVIDER_SITE_OTHER): Payer: 59 | Admitting: Podiatry

## 2023-07-25 DIAGNOSIS — B351 Tinea unguium: Secondary | ICD-10-CM | POA: Diagnosis not present

## 2023-07-25 DIAGNOSIS — M79674 Pain in right toe(s): Secondary | ICD-10-CM | POA: Diagnosis not present

## 2023-07-25 DIAGNOSIS — M79675 Pain in left toe(s): Secondary | ICD-10-CM

## 2023-07-25 MED ORDER — TERBINAFINE HCL 250 MG PO TABS: 250.0000 mg | ORAL_TABLET | Freq: Every day | ORAL | 0 refills | Status: DC

## 2023-07-25 NOTE — Progress Notes (Signed)
   Chief Complaint  Patient presents with   Nail Problem    "The left big toe is sore when I press down on the toenail.  It's been sore several times. I haven't noticed any changes." (Last A1c 7.1)    SUBJECTIVE Patient with a history of diabetes mellitus presents to office today complaining of elongated, thickened nails that cause pain while ambulating in shoes.  Patient is unable to trim their own nails.  Patient would also like to discuss different treatment options for her fungal nail infections.  She has tried different topical medications in the past with no improvement.  Patient is here for further evaluation and treatment.   Past Medical History:  Diagnosis Date   Arthritis    FEET   Diabetes mellitus without complication (HCC)    GERD (gastroesophageal reflux disease)    History of hiatal hernia    SMALL PER PT   HIV (human immunodeficiency virus infection) (HCC)    Hypertension    Hypothyroidism    Overactive bladder    Pneumonia 2007     OBJECTIVE General Patient is awake, alert, and oriented x 3 and in no acute distress. Derm Skin is dry and supple bilateral. Negative open lesions or macerations. Remaining integument unremarkable. Nails are tender, long, thickened and dystrophic with subungual debris, consistent with onychomycosis, 1-5 bilateral. No signs of infection noted. Vasc  DP and PT pedal pulses palpable bilaterally. Temperature gradient within normal limits.  Neuro light touch and protective threshold sensation diminished bilaterally.  Musculoskeletal Exam No symptomatic pedal deformities noted bilateral. Muscular strength within normal limits.  ASSESSMENT 1. Diabetes Mellitus w/ peripheral neuropathy 2.  Pain due to onychomycosis of toenails bilateral  PLAN OF CARE -Patient evaluated today.  Comprehensive diabetic foot exam performed today -Instructed to maintain good pedal hygiene and foot care. Stressed importance of controlling blood sugar.   -Mechanical debridement of nails 1-5 bilaterally performed using a nail nipper. Filed with dremel without incident.  -Today we discussed different treatment options for the thickening and discoloration of the fungal nail infection to the left hallux nail plate.  Discussed the relative efficacies and risks and benefits associated to each modality including oral, topical, and laser antifungal treatment modalities.  The patient opts for oral antifungal.  Denies a history of liver pathology or symptoms.  LFT 07/24/2023 AST, ALT, and AP WNL.  -Prescription for Lamisil 2 and 50 mg #90 daily  -return to clinic in 3 mos. for routine foot care  *works at [employer], DPM [facility]  Dr. Felecia Shelling, DPM    [address] [place], [place] [address]                Office [phone]  Fax [phone]

## 2023-09-30 LAB — COLOGUARD: COLOGUARD: POSITIVE — AB

## 2023-09-30 LAB — EXTERNAL GENERIC LAB PROCEDURE: COLOGUARD: POSITIVE — AB

## 2023-10-26 ENCOUNTER — Other Ambulatory Visit: Payer: Self-pay | Admitting: Podiatry

## 2023-10-29 ENCOUNTER — Other Ambulatory Visit: Payer: Self-pay | Admitting: Podiatry

## 2023-10-29 DIAGNOSIS — Z79899 Other long term (current) drug therapy: Secondary | ICD-10-CM

## 2023-10-29 NOTE — Progress Notes (Signed)
Updated hepatic function panel prior to refill of oral Lamisil

## 2023-12-05 DIAGNOSIS — R768 Other specified abnormal immunological findings in serum: Secondary | ICD-10-CM | POA: Insufficient documentation

## 2023-12-08 DIAGNOSIS — N951 Menopausal and female climacteric states: Secondary | ICD-10-CM | POA: Insufficient documentation

## 2023-12-16 ENCOUNTER — Ambulatory Visit: Admitting: Podiatry

## 2024-01-02 ENCOUNTER — Ambulatory Visit: Payer: Medicare Other | Admitting: Family Medicine

## 2024-01-20 ENCOUNTER — Ambulatory Visit (INDEPENDENT_AMBULATORY_CARE_PROVIDER_SITE_OTHER)

## 2024-01-20 ENCOUNTER — Encounter: Payer: Self-pay | Admitting: Podiatry

## 2024-01-20 ENCOUNTER — Ambulatory Visit (INDEPENDENT_AMBULATORY_CARE_PROVIDER_SITE_OTHER): Admitting: Podiatry

## 2024-01-20 DIAGNOSIS — M79674 Pain in right toe(s): Secondary | ICD-10-CM | POA: Diagnosis not present

## 2024-01-20 DIAGNOSIS — M7752 Other enthesopathy of left foot: Secondary | ICD-10-CM

## 2024-01-20 DIAGNOSIS — B351 Tinea unguium: Secondary | ICD-10-CM | POA: Diagnosis not present

## 2024-01-20 DIAGNOSIS — M79675 Pain in left toe(s): Secondary | ICD-10-CM

## 2024-01-20 DIAGNOSIS — S9032XA Contusion of left foot, initial encounter: Secondary | ICD-10-CM

## 2024-01-20 DIAGNOSIS — M779 Enthesopathy, unspecified: Secondary | ICD-10-CM

## 2024-01-20 NOTE — Progress Notes (Signed)
 No chief complaint on file.   SUBJECTIVE Patient with a history of diabetes mellitus presents to office today complaining of elongated, thickened nails that cause pain while ambulating in shoes.  Patient is unable to trim their own nails.  Past Medical History:  Diagnosis Date   Arthritis    FEET   Diabetes mellitus without complication (HCC)    GERD (gastroesophageal reflux disease)    History of hiatal hernia    SMALL PER PT   HIV (human immunodeficiency virus infection) (HCC)    Hypertension    Hypothyroidism    Overactive bladder    Pneumonia 2007   Past Surgical History:  Procedure Laterality Date   BIOPSY THYROID      BREAST SURGERY     BIOPSY   CARPAL TUNNEL RELEASE  2012   CERVIX SURGERY  2013   COLONOSCOPY  2014   polyps benign   CRYOTHERAPY  2013-2014   CYSTOSCOPY  10/30/2017   Procedure: CYSTOSCOPY;  Surgeon: Alben Alma, MD;  Location: [facility] ORS;  Service: Gynecology;;   JOINT REPLACEMENT Left 2011   HIP   LAPAROSCOPIC HYSTERECTOMY Bilateral 10/30/2017   Procedure: HYSTERECTOMY TOTAL LAPAROSCOPIC BILATERAL SALPINGECTOMY;  Surgeon: Alben Alma, MD;  Location: [facility] OR[facility];  Service: Gynecology;  Laterality: Bilateral;   Allergies  Allergen Reactions   Sulfa Antibiotics Other (See Comments)    Unknown     OBJECTIVE General Patient is awake, alert, and oriented x 3 and in no acute distress. Derm Skin is dry and supple bilateral. Negative open lesions or macerations. Remaining integument unremarkable. Nails are tender, long, thickened and dystrophic with subungual debris, consistent with onychomycosis, 1-5 bilateral. No signs of infection noted. Vasc  DP and PT pedal pulses palpable bilaterally. Temperature gradient within normal limits.  Neuro light touch and protective threshold sensation diminished bilaterally.  Musculoskeletal Exam tenderness with palpation overlying the dorsal lateral aspect of the left foot around the fourth metatarsal Radiographic  exam LT foot 01/20/2024 normal osseous mineralization.  Joint spaces preserved.  No acute fracture identified  ASSESSMENT 1. Diabetes Mellitus w/ peripheral neuropathy 2.  Pain due to onychomycosis of toenails bilateral 3.  Contusion dorsum of the left forefoot  PLAN OF CARE -Patient evaluated today.  X-rays reviewed today which appear to be negative for fracture -Instructed to maintain good pedal hygiene and foot care. Stressed importance of controlling blood sugar.  -Mechanical debridement of nails 1-5 bilaterally performed using a nail nipper. Filed with dremel without incident.  -Patient has updated blood work with her PCP on 01/30/2024.  At that time she will have hepatic function panel evaluated.  If WNL we will consider refill of the oral Lamisil  x 90 days -return to clinic in 3 mos. for routine foot care  *works at [employer], DPM [facility]  Dr. Dot Gazella, DPM    [address] [place], [place] [address]                Office [phone]  Fax [phone]

## 2024-02-02 ENCOUNTER — Ambulatory Visit: Admitting: Family Medicine

## 2024-02-02 ENCOUNTER — Encounter: Payer: Self-pay | Admitting: Family Medicine

## 2024-02-02 VITALS — BP 122/72 | HR 75 | Ht 67.0 in | Wt 272.0 lb

## 2024-02-02 DIAGNOSIS — G5602 Carpal tunnel syndrome, left upper limb: Secondary | ICD-10-CM

## 2024-02-02 DIAGNOSIS — I10 Essential (primary) hypertension: Secondary | ICD-10-CM

## 2024-02-02 DIAGNOSIS — E1169 Type 2 diabetes mellitus with other specified complication: Secondary | ICD-10-CM

## 2024-02-02 DIAGNOSIS — Z21 Asymptomatic human immunodeficiency virus [HIV] infection status: Secondary | ICD-10-CM

## 2024-02-02 DIAGNOSIS — E11649 Type 2 diabetes mellitus with hypoglycemia without coma: Secondary | ICD-10-CM

## 2024-02-02 DIAGNOSIS — E039 Hypothyroidism, unspecified: Secondary | ICD-10-CM

## 2024-02-02 DIAGNOSIS — R195 Other fecal abnormalities: Secondary | ICD-10-CM | POA: Insufficient documentation

## 2024-02-02 DIAGNOSIS — F40298 Other specified phobia: Secondary | ICD-10-CM

## 2024-02-02 DIAGNOSIS — E1165 Type 2 diabetes mellitus with hyperglycemia: Secondary | ICD-10-CM

## 2024-02-02 DIAGNOSIS — F17210 Nicotine dependence, cigarettes, uncomplicated: Secondary | ICD-10-CM

## 2024-02-02 DIAGNOSIS — K5790 Diverticulosis of intestine, part unspecified, without perforation or abscess without bleeding: Secondary | ICD-10-CM

## 2024-02-02 DIAGNOSIS — Z1211 Encounter for screening for malignant neoplasm of colon: Secondary | ICD-10-CM

## 2024-02-02 DIAGNOSIS — N951 Menopausal and female climacteric states: Secondary | ICD-10-CM

## 2024-02-02 DIAGNOSIS — K219 Gastro-esophageal reflux disease without esophagitis: Secondary | ICD-10-CM

## 2024-02-02 MED ORDER — METFORMIN HCL 500 MG PO TABS: 500.0000 mg | ORAL_TABLET | Freq: Two times a day (BID) | ORAL | Status: DC

## 2024-02-02 MED ORDER — TIRZEPATIDE 15 MG/0.5ML ~~LOC~~ SOAJ: 15.0000 mg | SUBCUTANEOUS | 3 refills | Status: AC

## 2024-02-02 NOTE — Assessment & Plan Note (Signed)
  Chronic HIV infection with effective current medication regimen. - Continue Dovato  50-300 mg daily.

## 2024-02-02 NOTE — Assessment & Plan Note (Signed)
 Chronic  Will need updated lipid panel at next follow up visit  Continue crestor 10mg  daily

## 2024-02-02 NOTE — Assessment & Plan Note (Signed)
 Hypothyroidism, chronic  Hypothyroidism with recent thyroid  levels within normal limits under current management. - Continue Synthroid  50 mcg daily.

## 2024-02-02 NOTE — Assessment & Plan Note (Signed)
 Hypertension Chronic Well-controlled hypertension with current blood pressure at 122/72 mmHg. Continue current management. - Continue metoprolol  100 mg daily.

## 2024-02-02 NOTE — Assessment & Plan Note (Signed)
 Vasomotor symptoms managed with Veozah. She reports improvement in symptoms but experiences occasional fogging of glasses due to heat. Insurance authorization issues for [employer]  were discussed. - Continue Veozah 45 mg daily. - Address insurance authorization for Veozah if needed.

## 2024-02-02 NOTE — Progress Notes (Signed)
 New patient visit   Patient: Misty Larsen   DOB: 1966/01/04   58 y.o. Female  MRN: [id number] Visit Date: 02/02/2024  Today's healthcare provider: Mimi Alt, MD   Chief Complaint  Patient presents with   Establish Care    Discuss increase medication     Care Management    Pattern of eating   *General diet  Are you exercising: No   *Foot exam  *A1c *Denied vaccines     Subjective    Misty Larsen is a 58 y.o. female who presents today as a new patient to establish care.   HPI     Establish Care    Additional comments: Discuss increase medication          Care Management    Additional comments: Pattern of eating   *General diet  Are you exercising: No   *Foot exam  *A1c *Denied vaccines        Last edited by Bart Lieu, CMA on 02/02/2024 10:33 AM.       Discussed the use of AI scribe software for clinical note transcription with the patient, who gave verbal consent to proceed.  History of Present Illness Misty Larsen is a 58 year old female who presents for medication dose adjustments and a referral for a colonoscopy.  She is seeking a referral for a colonoscopy following a positive Cologuard screening in December 2024. She has a history of diverticulitis and was hospitalized in 2021. She is concerned about the preparation for the colonoscopy due to transportation issues and the interaction of Mounjaro with anesthesia.  She has type two diabetes, currently managed with metformin  500 mg twice daily and Mounjaro, with a potential increase from 12.5 mg to 15 mg weekly. Her last A1c was 5.8 in January, indicating well-controlled diabetes. She occasionally uses dandelion root oil to help manage her blood sugar levels. She denies current use of Rybelsus for diabetes.  Her hypertension is well-controlled with metoprolol  100 mg daily. She also has arthritis in her feet, exacerbated by certain foods like potatoes, causing pain and  limping. She uses naproxen as needed for pain management.  She has a history of gastroesophageal reflux disease (GERD) and is currently taking omeprazole  40 mg daily and Pepcid 40 mg as needed. She has a history of a hiatal hernia. She reports a knot under her breastbone that causes occasional soreness.  She is HIV positive and is on Dovato  50-300 mg daily for chronic stable HIV. She has hypothyroidism, managed with Synthroid  50 mcg daily. She had a thyroid  biopsy in October 2024, which she tolerated well with pre-medication for anxiety.  She experiences vasomotor symptoms and is taking Veozah 45 mg daily, which she believes is helping with her appetite and weight management. Her weight is currently 272 pounds with a BMI of 42.  She has a history of overactive bladder, which was exacerbated post-hysterectomy in 2019 but is now under control. She has a history of breast surgery and a family history of breast cancer, with her mother having died from it in 30 and her sister in remission.  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 She reports occasional burning sensation when urinating, possibly related to vaginal dryness.    Scheduled to see Antony [facility] for her DM eye exam, concern for DM retinopathy    Past Medical History:  Diagnosis Date   Arthritis  FEET   Diabetes mellitus without complication (HCC)    GERD (gastroesophageal reflux disease)    History of hiatal hernia    SMALL PER PT   HIV (human immunodeficiency virus infection) (HCC)    Hypertension    Hypothyroidism    Overactive bladder    Pneumonia 2007    Outpatient Medications Prior to Visit  Medication Sig   DOVATO  50-300 MG TABS Take 1 tablet by mouth daily.   levothyroxine  (SYNTHROID , LEVOTHROID) 50 MCG tablet Take 50 mcg by mouth daily before breakfast.   metoprolol  succinate (TOPROL -XL) 100 MG 24 hr tablet Take 100 mg by  mouth daily.   omeprazole  (PRILOSEC) 40 MG capsule Take 40 mg by mouth daily.   rosuvastatin (CRESTOR) 10 MG tablet Take by mouth.   VALACYCLOVIR HCL PO    VEOZAH 45 MG TABS Take 45 mg by mouth.   [DISCONTINUED] metFORMIN  (GLUCOPHAGE ) 500 MG tablet Take 0.5 tablets (250 mg total) by mouth 2 (two) times daily. (Patient taking differently: Take 500 mg by mouth 2 (two) times daily with a meal.)   [DISCONTINUED] MOUNJARO 12.5 MG/0.5ML Pen 12.5 mg.   [DISCONTINUED] acetaminophen  (TYLENOL ) 650 MG CR tablet Take 650 mg by mouth every 8 (eight) hours as needed for pain.   [DISCONTINUED] famotidine (PEPCID) 40 MG tablet Take by mouth daily as needed.    [DISCONTINUED] pimecrolimus (ELIDEL) 1 % cream Apply 1 application topically 2 (two) times daily.   [DISCONTINUED] RYBELSUS 7 MG TABS SMARTSIG:7 Milligram(s) By Mouth Daily   [DISCONTINUED] terbinafine  (LAMISIL ) 250 MG tablet Take 1 tablet (250 mg total) by mouth daily. (Patient not taking: Reported on 02/02/2024)   [DISCONTINUED] triamcinolone ointment (KENALOG) 0.1 % Apply 1 application topically as directed.   No facility-administered medications prior to visit.    Past Surgical History:  Procedure Laterality Date   BIOPSY THYROID      BREAST SURGERY     BIOPSY   CARPAL TUNNEL RELEASE  2012   CERVIX SURGERY  2013   COLONOSCOPY  2014   polyps benign   CRYOTHERAPY  2013-2014   CYSTOSCOPY  10/30/2017   Procedure: CYSTOSCOPY;  Surgeon: Alben Alma, MD;  Location: [facility] OR[facility];  Service: Gynecology;;   JOINT REPLACEMENT Left 2011   HIP   LAPAROSCOPIC HYSTERECTOMY Bilateral 10/30/2017   Procedure: HYSTERECTOMY TOTAL LAPAROSCOPIC BILATERAL SALPINGECTOMY;  Surgeon: Alben Alma, MD;  Location: [facility] OR[facility];  Service: Gynecology;  Laterality: Bilateral;   Family Status  Relation Name Status   Mother Philis Brazier Deceased   Father  Deceased  No partnership data on file   Family History  Problem Relation Age of Onset   Breast cancer Mother    Social  History   Socioeconomic History   Marital status: Single    Spouse name: Not on file   Number of children: Not on file   Years of education: Not on file   Highest education level: 11th grade  Occupational History   Not on file  Tobacco Use   Smoking status: Former    Current packs/day: 0.00    Average packs/day: 0.5 packs/day for 41.0 years (20.5 ttl pk-yrs)    Types: Cigarettes    Start date: 12/30/1975    Quit date: 01/05/2017    Years since quitting: 7.0   Smokeless tobacco: Never  Vaping Use   Vaping status: Former  Substance and Sexual Activity   Alcohol use: Yes    Comment: Occasional   Drug use: No    Types: "Crack"  cocaine    Comment: PT STATES SHE HAS BEEN CLEAN FOR 6 YEARS NOW    Sexual activity: Not Currently    Birth control/protection: Post-menopausal  Other Topics Concern   Not on file  Social History Narrative   Not on file   Social Drivers of Health   Financial Resource Strain: Low Risk  (01/26/2024)   Overall Financial Resource Strain (CARDIA)    Difficulty of Paying Living Expenses: Not hard at all  Food Insecurity: No Food Insecurity (01/26/2024)   Hunger Vital Sign    Worried About Running Out of Food in the Last Year: Never true    Ran Out of Food in the Last Year: Never true  Transportation Needs: No Transportation Needs (01/26/2024)   PRAPARE - Administrator, Civil Service (Medical): No    Lack of Transportation (Non-Medical): No  Physical Activity: Unknown (01/26/2024)   Exercise Vital Sign    Days of Exercise per Week: 0 days    Minutes of Exercise per Session: Not on file  Stress: No Stress Concern Present (01/26/2024)   [employer] of Occupational Health - Occupational Stress Questionnaire    Feeling of Stress : Not at all  Social Connections: Socially Isolated (01/26/2024)   Social Connection and Isolation Panel [NHANES]    Frequency of Communication with Friends and Family: More than three times a week    Frequency of  Social Gatherings with Friends and Family: Never    Attends Religious Services: Never    Database administrator or Organizations: No    Attends Engineer, structural: Not on file    Marital Status: Never married     Allergies  Allergen Reactions   Sulfa Antibiotics Other (See Comments)    Unknown     There is no immunization history on file for this patient.  Health Maintenance  Topic Date Due   Medicare Annual Wellness (AWV)  Never done   COVID-19 Vaccine (1) Never done   Diabetic kidney evaluation - Urine ACR  Never done   Hepatitis C Screening  Never done   DTaP/Tdap/Td (1 - Tdap) Never done   Pneumococcal Vaccine 2-43 Years old (1 of 2 - PCV) Never done   Zoster Vaccines- Shingrix (1 of 2) Never done   Cervical Cancer Screening (HPV/Pap Cotest)  Never done   Colonoscopy  Never done   Lung Cancer Screening  Never done   MAMMOGRAM  Never done   OPHTHALMOLOGY EXAM  07/30/2018   HEMOGLOBIN A1C  11/04/2020   Diabetic kidney evaluation - eGFR measurement  09/30/2021   FOOT EXAM  04/27/2023   INFLUENZA VACCINE  04/30/2024   HIV Screening  Completed   HPV VACCINES  Aged Out   Meningococcal B Vaccine  Aged Out    Patient Care Team: Mimi Alt, MD as PCP - General (Family Medicine)  Review of Systems  Last CBC Lab Results  Component Value Date   WBC 8.4 09/30/2020   HGB 15.2 (H) 09/30/2020   HCT 47.3 (H) 09/30/2020   MCV 86.0 09/30/2020   MCH 27.6 09/30/2020   RDW 14.4 09/30/2020   PLT 271 09/30/2020   Last metabolic panel Lab Results  Component Value Date   GLUCOSE 86 09/30/2020   NA 139 09/30/2020   K 4.1 09/30/2020   CL 104 09/30/2020   CO2 24 09/30/2020   BUN 16 09/30/2020   CREATININE 0.98 09/30/2020   GFRNONAA >60 09/30/2020   CALCIUM 9.0 09/30/2020  PROT 9.6 (H) 05/04/2020   ALBUMIN 4.6 05/04/2020   BILITOT 1.3 (H) 05/04/2020   ALKPHOS 60 05/04/2020   AST 20 05/04/2020   ALT 21 05/04/2020   ANIONGAP 11 09/30/2020    Last lipids No results found for: "CHOL", "HDL", "LDLCALC", "LDLDIRECT", "TRIG", "CHOLHDL" Last hemoglobin A1c Lab Results  Component Value Date   HGBA1C 5.9 (H) 05/04/2020    The 10-year ASCVD risk score (Arnett DK, et al., 2019) is: 10.9%     Objective    BP 122/72   Pulse 75   Ht 5\' 7"  (1.702 m)   Wt 272 lb (123.4 kg)   LMP 07/04/2009   BMI 42.60 kg/m  BP Readings from Last 3 Encounters:  02/02/24 122/72  09/30/20 (!) 151/92  05/08/20 133/77   Wt Readings from Last 3 Encounters:  02/02/24 272 lb (123.4 kg)  09/30/20 242 lb (109.8 kg)  05/04/20 252 lb (114.3 kg)        Depression Screen    01/02/2018    3:13 PM  PHQ 2/9 Scores  PHQ - 2 Score 0   No results found for any visits on 02/02/24.   Physical Exam Vitals reviewed.  Constitutional:      General: She is not in acute distress.    Appearance: Normal appearance. She is not ill-appearing, toxic-appearing or diaphoretic.  Eyes:     Conjunctiva/sclera: Conjunctivae normal.  Cardiovascular:     Rate and Rhythm: Normal rate and regular rhythm.     Pulses: Normal pulses.     Heart sounds: Normal heart sounds. No murmur heard.    No friction rub. No gallop.  Pulmonary:     Effort: Pulmonary effort is normal. No respiratory distress.     Breath sounds: Normal breath sounds. No stridor. No wheezing, rhonchi or rales.  Abdominal:     General: Bowel sounds are normal. There is no distension.     Palpations: Abdomen is soft.     Tenderness: There is no abdominal tenderness.  Musculoskeletal:     Right lower leg: No edema.     Left lower leg: No edema.  Skin:    Findings: No erythema or rash.  Neurological:     Mental Status: She is alert and oriented to person, place, and time.  Psychiatric:        Mood and Affect: Mood and affect normal.        Speech: Speech normal.        Behavior: Behavior normal. Behavior is cooperative.        Assessment & Plan      Problem List Items Addressed This  Visit       Cardiovascular and Mediastinum   Hypertension   Hypertension Chronic Well-controlled hypertension with current blood pressure at 122/72 mmHg. Continue current management. - Continue metoprolol  100 mg daily.        Digestive   Gastroesophageal reflux disease   Chronic GERD managed with current medication regimen. She reports using omeprazole  and Pepcid as needed. - Continue omeprazole  40 mg daily. - Continue Pepcid 40 mg as needed.      Diverticulitis large intestine w/o perforation or abscess w/o bleeding     Endocrine   RESOLVED: Uncontrolled type 2 diabetes mellitus with hypoglycemia (HCC)   Relevant Medications   tirzepatide (MOUNJARO) 15 MG/0.5ML Pen   metFORMIN  (GLUCOPHAGE ) 500 MG tablet   Type 2 diabetes mellitus with hyperglycemia (HCC) - Primary   Type 2 diabetes mellitus Chronic, well controlled  Type  2 diabetes mellitus with well-controlled A1c of 5.8% as of January. Discussed potential increase in Mounjaro dosage to optimize glycemic control. She is currently on metformin  and Mounjaro. - Continue metformin  500 mg twice daily. - Increase Mounjaro from 12.5 mg to 15 mg weekly.      Relevant Medications   tirzepatide (MOUNJARO) 15 MG/0.5ML Pen   metFORMIN  (GLUCOPHAGE ) 500 MG tablet   Thyroid  nodule   Hypothyroidism, chronic  Hypothyroidism with recent thyroid  levels within normal limits under current management. - Continue Synthroid  50 mcg daily.        Nervous and Auditory   Carpal tunnel syndrome, left     Other   Vasomotor symptoms due to menopause   Vasomotor symptoms managed with Veozah. She reports improvement in symptoms but experiences occasional fogging of glasses due to heat. Insurance authorization issues for [employer]  were discussed. - Continue Veozah 45 mg daily. - Address insurance authorization for Veozah if needed.      Positive colorectal cancer screening using Cologuard test   Relevant Orders   Ambulatory referral to  Gastroenterology   Needle phobia   Hypercholesteremia   Chronic  Will need updated lipid panel at next follow up visit  Continue crestor 10mg  daily       Asymptomatic HIV infection (HCC)    Chronic HIV infection with effective current medication regimen. - Continue Dovato  50-300 mg daily.      Other Visit Diagnoses       Screening for colon cancer       Relevant Orders   Ambulatory referral to Gastroenterology     Smoking greater than 40 pack years       Relevant Orders   Ambulatory Referral for Lung Cancer Screening [REF832]      Assessment & Plan Wellness Visit Routine wellness visit to review medical history, medications, and health maintenance. Discussed the need for a colonoscopy following a positive Cologuard screening in December 2024. Addressed concerns about transportation and preparation for the procedure, including the impact of Mounjaro on anesthesia. Reviewed her medication regimen and addressed necessary adjustments. - Refer to gastroenterology for colonoscopy following positive Cologuard screening. - Review and adjust medication regimen as needed.   Overactive bladder Overactive bladder, currently well-managed post-surgical intervention. No current issues reported.  Arthritis of the feet Chronic arthritis of the feet with intermittent pain. She uses naproxen as needed for pain management. - Use naproxen as needed for pain management.     Return in about 4 months (around 06/04/2024) for DM, HTN,thyroid .      Mimi Alt, MD  [facility] [phone] (phone) [phone] (fax)  [facility]

## 2024-02-02 NOTE — Assessment & Plan Note (Signed)
 Chronic GERD managed with current medication regimen. She reports using omeprazole  and Pepcid as needed. - Continue omeprazole  40 mg daily. - Continue Pepcid 40 mg as needed.

## 2024-02-02 NOTE — Assessment & Plan Note (Signed)
 Type 2 diabetes mellitus Chronic, well controlled  Type 2 diabetes mellitus with well-controlled A1c of 5.8% as of January. Discussed potential increase in Mounjaro dosage to optimize glycemic control. She is currently on metformin  and Mounjaro. - Continue metformin  500 mg twice daily. - Increase Mounjaro from 12.5 mg to 15 mg weekly.

## 2024-02-02 NOTE — Patient Instructions (Signed)
  VISIT SUMMARY: During your visit, we reviewed your medical history, medications, and overall health. We discussed the need for a colonoscopy following your positive Cologuard screening and addressed your concerns about the procedure. We also reviewed and adjusted your medication regimen as needed.  YOUR PLAN: -COLONOSCOPY REFERRAL: A colonoscopy is a procedure to examine the inside of your colon. You need this following a positive Cologuard screening. We will refer you to a gastroenterologist for this procedure.  -TYPE 2 DIABETES MELLITUS: Type 2 diabetes is a condition where your body does not use insulin  properly. Your A1c is well-controlled at 5.8%. Continue taking metformin  500 mg twice daily, and increase your Mounjaro dose from 12.5 mg to 15 mg weekly to optimize your blood sugar control.  -HYPERTENSION: Hypertension is high blood pressure. Your blood pressure is well-controlled at 122/72 mmHg. Continue taking metoprolol  100 mg daily.  -CHRONIC HIV INFECTION: HIV is a virus that attacks the immune system. Your condition is stable with your current medication. Continue taking Dovato  50-300 mg daily.  -HYPOTHYROIDISM: Hypothyroidism is when your thyroid  does not produce enough hormones. Your thyroid  levels are normal with your current medication. Continue taking Synthroid  50 mcg daily.  -GASTROESOPHAGEAL REFLUX DISEASE (GERD): GERD is a condition where stomach acid frequently flows back into the tube connecting your mouth and stomach. Continue taking omeprazole  40 mg daily and Pepcid 40 mg as needed.  -ARTHRITIS OF THE FEET: Arthritis is inflammation of the joints. You experience intermittent pain in your feet. Use naproxen as needed for pain management.  -VASOMOTOR SYMPTOMS: Vasomotor symptoms include hot flashes and night sweats. You are experiencing improvement with Veozah. Continue taking Veozah 45 mg daily, and we will address any insurance authorization issues if  needed.  INSTRUCTIONS: You will be referred to a gastroenterologist for a colonoscopy following your positive Cologuard screening. Continue with your current medications as discussed, and increase your Mounjaro dose to 15 mg weekly. If you have any issues with insurance authorization for [facility], please let us  know.                      Contains text generated by [employer]bridge.                                 Contains text generated by [employer]bridge.

## 2024-02-04 ENCOUNTER — Telehealth: Payer: Self-pay

## 2024-02-04 ENCOUNTER — Other Ambulatory Visit: Payer: Self-pay

## 2024-02-04 DIAGNOSIS — R195 Other fecal abnormalities: Secondary | ICD-10-CM

## 2024-02-04 DIAGNOSIS — Z860100 Personal history of colon polyps, unspecified: Secondary | ICD-10-CM

## 2024-02-04 MED ORDER — GOLYTELY 236 G PO SOLR: 4000.0000 mL | Freq: Once | ORAL | 0 refills | Status: AC

## 2024-02-04 NOTE — Telephone Encounter (Signed)
 Gastroenterology Pre-Procedure Review  Request Date: 04/01/24 Requesting Physician: Dr. Ole Berkeley  PATIENT REVIEW QUESTIONS: The patient responded to the following health history questions as indicated:    1. Are you having any GI issues?  Not currently but has had issues with hemorrhoids . Referral noted "positive cologuard" 2. Do you have a personal history of Polyps? yes (Pt stated her last colonoscopy was performed in [place] around 2014-2015) 3. Do you have a family history of Colon Cancer or Polyps?  Sister had colon health condition where she had to have part of colon removed however doesn't think it was colon cancer 4. Diabetes Mellitus? yes (pt is diabetic she has been advised that she will need to stop Mounjaro 1 week prior to her colonoscopy.  She also takes Metformin  and has been asked to stop Metformin  (2) days prior to her colonoscopy.  Pt advised to contact her PCP in regards to managing her blood glucose prior to preparing for her colonoscopy in case she experiences any fluctuations) 5. Joint replacements in the past 12 months?no 6. Major health problems in the past 3 months?no 7. Any artificial heart valves, MVP, or defibrillator?no    MEDICATIONS & ALLERGIES:    Patient reports the following regarding taking any anticoagulation/antiplatelet therapy:   Plavix, Coumadin, Eliquis, Xarelto, Lovenox , Pradaxa, Brilinta, or Effient? no Aspirin? no  Patient confirms/reports the following medications:  Current Outpatient Medications  Medication Sig Dispense Refill   polyethylene glycol (GOLYTELY) 236 g solution Take 4,000 mLs by mouth once for 1 dose. 4000 mL 0   DOVATO  50-300 MG TABS Take 1 tablet by mouth daily.     levothyroxine  (SYNTHROID , LEVOTHROID) 50 MCG tablet Take 50 mcg by mouth daily before breakfast.     metFORMIN  (GLUCOPHAGE ) 500 MG tablet Take 1 tablet (500 mg total) by mouth 2 (two) times daily with a meal.     metoprolol  succinate (TOPROL -XL) 100 MG 24 hr tablet Take 100 mg  by mouth daily.     omeprazole  (PRILOSEC) 40 MG capsule Take 40 mg by mouth daily.     rosuvastatin (CRESTOR) 10 MG tablet Take by mouth.     tirzepatide (MOUNJARO) 15 MG/0.5ML Pen Inject 15 mg into the skin once a week. 6 mL 3   VALACYCLOVIR HCL PO      VEOZAH 45 MG TABS Take 45 mg by mouth.     No current facility-administered medications for this visit.    Patient confirms/reports the following allergies:  Allergies  Allergen Reactions   Sulfa Antibiotics Other (See Comments)    Unknown    No orders of the defined types were placed in this encounter.   AUTHORIZATION INFORMATION Primary Insurance: 1D#: Group #:  Secondary Insurance: 1D#: Group #:  SCHEDULE INFORMATION: Date: 04/01/24 Time: Location: [facility]

## 2024-02-05 ENCOUNTER — Telehealth: Payer: Self-pay | Admitting: Acute Care

## 2024-02-05 DIAGNOSIS — Z87891 Personal history of nicotine dependence: Secondary | ICD-10-CM

## 2024-02-05 DIAGNOSIS — Z122 Encounter for screening for malignant neoplasm of respiratory organs: Secondary | ICD-10-CM

## 2024-02-05 NOTE — Telephone Encounter (Signed)
 Lung Cancer Screening Narrative/Criteria Questionnaire (Cigarette Smokers Only- No Cigars/Pipes/vapes)   Manuel Cortese   SDMV:02/24/2024 1:30 Natalie        14-Aug-1966   LDCT: 03/01/2024 9:00 [facility]    58 y.o.   Phone: [phone]  Lung Screening Narrative (confirm age 72-77 yrs Medicare / 50-80 yrs Private pay insurance)   Solicitor and mcd   Referring Provider:Dr. Simmons-Robsinson   This screening involves an initial phone call with a team member from our program. It is called a shared decision making visit. The initial meeting is required by  insurance and Medicare to make sure you understand the program. This appointment takes about 15-20 minutes to complete. You will complete the screening scan at your scheduled date/time.  This scan takes about 5-10 minutes to complete. You can eat and drink normally before and after the scan.  Criteria questions for Lung Cancer Screening:   Are you a current or former smoker? Former Age began smoking: 58yo   If you are a former smoker, what year did you quit smoking? 2018(within 15 yrs)   To calculate your smoking history, I need an accurate estimate of how many packs of cigarettes you smoked per day and for how many years. (Not just the number of PPD you are now smoking)   Years smoking 41 x Packs per day 1/2 = Pack years 20.5   (at least 20 pack yrs)   (Make sure they understand that we need to know how much they have smoked in the past, not just the number of PPD they are smoking now)  Do you have a personal history of cancer?  No    Do you have a family history of cancer? Yes  (cancer type and and relative) mother - breast and sister - breast  Are you coughing up blood?  No  Have you had unexplained weight loss of 15 lbs or more in the last 6 months? No  It looks like you meet all criteria.  When would be a good time for us  to schedule you for this screening?   Additional information: N/A

## 2024-02-24 ENCOUNTER — Ambulatory Visit: Admitting: *Deleted

## 2024-02-24 DIAGNOSIS — Z87891 Personal history of nicotine dependence: Secondary | ICD-10-CM

## 2024-02-24 NOTE — Patient Instructions (Signed)

## 2024-02-24 NOTE — Progress Notes (Signed)
 Virtual Visit via Telephone Note  I connected with Misty Larsen on 02/24/24 at  1:30 PM EDT by telephone and verified that I am speaking with the correct person using two identifiers.  Location: Patient: Misty Larsen Provider: Alyse Bach, RN   I discussed the limitations, risks, security and privacy concerns of performing an evaluation and management service by telephone and the availability of in person appointments. I also discussed with the patient that there may be a patient responsible charge related to this service. The patient expressed understanding and agreed to proceed.   Shared Decision Making Visit Lung Cancer Screening Program [phone])   Eligibility: Age 58 y.o. Pack Years Smoking History Calculation 20 (# packs/per year x # years smoked) Recent History of coughing up blood  no Unexplained weight loss? no ( >Than 15 pounds within the last 6 months ) Prior History Lung / other cancer no (Diagnosis within the last 5 years already requiring surveillance chest CT Scans). Smoking Status Former Smoker Former Smokers: Years since quit: 7 years  Quit Date: 2018  Visit Components: Discussion included one or more decision making aids. yes Discussion included risk/benefits of screening. yes Discussion included potential follow up diagnostic testing for abnormal scans. yes Discussion included meaning and risk of over diagnosis. yes Discussion included meaning and risk of False Positives. yes Discussion included meaning of total radiation exposure. yes  Counseling Included: Importance of adherence to annual lung cancer LDCT screening. yes Impact of comorbidities on ability to participate in the program. yes Ability and willingness to under diagnostic treatment. yes  Smoking Cessation Counseling: Current Smokers:  Discussed importance of smoking cessation. yes Information about tobacco cessation classes and interventions provided to patient. yes Patient provided  with "ticket" for LDCT Scan. no Symptomatic Patient. no  Counseling(Intermediate counseling: > three minutes) 99406 Diagnosis Code: Tobacco Use Z72.0 Asymptomatic Patient yes  Counseling (Intermediate counseling: > three minutes counseling) W1191 Former Smokers:  Discussed the importance of maintaining cigarette abstinence. yes Diagnosis Code: Personal History of Nicotine Dependence. Y78.295 Information about tobacco cessation classes and interventions provided to patient. Yes Patient provided with "ticket" for LDCT Scan. no Written Order for Lung Cancer Screening with LDCT placed in Epic. Yes (CT Chest Lung Cancer Screening Low Dose W/O CM) [id number] Z12.2-Screening of respiratory organs Z87.891-Personal history of nicotine dependence   Alyse Bach, RN

## 2024-03-01 ENCOUNTER — Ambulatory Visit
Admission: RE | Admit: 2024-03-01 | Discharge: 2024-03-01 | Disposition: A | Discharge status: Home / Self Care | Source: Ambulatory Visit | Attending: Acute Care | Admitting: Acute Care

## 2024-03-01 DIAGNOSIS — Z87891 Personal history of nicotine dependence: Secondary | ICD-10-CM | POA: Diagnosis present

## 2024-03-01 DIAGNOSIS — Z122 Encounter for screening for malignant neoplasm of respiratory organs: Secondary | ICD-10-CM | POA: Insufficient documentation

## 2024-03-18 ENCOUNTER — Telehealth: Payer: Self-pay

## 2024-03-18 DIAGNOSIS — Z87891 Personal history of nicotine dependence: Secondary | ICD-10-CM

## 2024-03-18 DIAGNOSIS — R911 Solitary pulmonary nodule: Secondary | ICD-10-CM

## 2024-03-18 NOTE — Telephone Encounter (Signed)
 Called patient to review results. 1st scan, 3 nodules, largest, 7.8 mm.   IMPRESSION: 1. Lung-RADS 3, probably benign findings. Short-term follow-up in 6 months is recommended with repeat low-dose chest CT without contrast (please use the following order, CT CHEST LCS NODULE FOLLOW-UP W/O CM). 2. No chest wall mass identified. If there is a clinical concern for breast lesion referral to mammography for diagnostic mammogram is advised. 3. Aortic Atherosclerosis (ICD10-I70.0) and Emphysema (ICD10-J43.9).

## 2024-03-19 ENCOUNTER — Telehealth: Payer: Self-pay

## 2024-03-19 NOTE — Telephone Encounter (Signed)
 Spoke with pt. She had questions regarding a knot she had previously felt in the breast area. Advised pt that report showed no chest wall mass. Diagnostic mammogram was negative in 11/2023. Advised pt to consult with PCP regarding any further work up. Pt verbalized understanding.

## 2024-03-19 NOTE — Telephone Encounter (Signed)
 Spoke with pt and reviewed lung screening CT results. Small nodules noted that we would like to look at again in 6 months with a repeat CT scan. Advised patient of findings of Mild emphysema and atherosclerosis. Patient is currently on Crestor. Patient verbalized understanding and is aware we will contact her closer to 6 months to schedule her CT. Results/ plans faxed to PCP.

## 2024-03-19 NOTE — Telephone Encounter (Signed)
 Copied from CRM [id number]. Topic: Clinical - Lab/Test Results >> Mar 19, 2024 12:38 PM Corean Deutscher wrote: Reason for CRM: Patient called requesting to speak with Tyra Galley regarding her test results.

## 2024-03-19 NOTE — Addendum Note (Signed)
 Addended by: Pearl Bottcher D on: 03/19/2024 10:05 AM   Modules accepted: Orders

## 2024-04-01 ENCOUNTER — Encounter: Payer: Self-pay | Admitting: Gastroenterology

## 2024-04-01 ENCOUNTER — Ambulatory Visit: Admitting: Anesthesiology

## 2024-04-01 ENCOUNTER — Other Ambulatory Visit: Payer: Self-pay

## 2024-04-01 ENCOUNTER — Ambulatory Visit
Admission: RE | Admit: 2024-04-01 | Discharge: 2024-04-01 | Disposition: A | Discharge status: Home / Self Care | Attending: Gastroenterology | Admitting: Gastroenterology

## 2024-04-01 ENCOUNTER — Encounter
Admission: RE | Disposition: A | Discharge status: Home / Self Care | Payer: Self-pay | Source: Home / Self Care | Attending: Gastroenterology

## 2024-04-01 DIAGNOSIS — K449 Diaphragmatic hernia without obstruction or gangrene: Secondary | ICD-10-CM | POA: Insufficient documentation

## 2024-04-01 DIAGNOSIS — Z6841 Body Mass Index (BMI) 40.0 and over, adult: Secondary | ICD-10-CM | POA: Diagnosis not present

## 2024-04-01 DIAGNOSIS — R195 Other fecal abnormalities: Secondary | ICD-10-CM | POA: Diagnosis not present

## 2024-04-01 DIAGNOSIS — Z7984 Long term (current) use of oral hypoglycemic drugs: Secondary | ICD-10-CM | POA: Insufficient documentation

## 2024-04-01 DIAGNOSIS — D123 Benign neoplasm of transverse colon: Secondary | ICD-10-CM | POA: Insufficient documentation

## 2024-04-01 DIAGNOSIS — E6689 Other obesity not elsewhere classified: Secondary | ICD-10-CM | POA: Diagnosis not present

## 2024-04-01 DIAGNOSIS — Z7985 Long-term (current) use of injectable non-insulin antidiabetic drugs: Secondary | ICD-10-CM | POA: Insufficient documentation

## 2024-04-01 DIAGNOSIS — E119 Type 2 diabetes mellitus without complications: Secondary | ICD-10-CM | POA: Insufficient documentation

## 2024-04-01 DIAGNOSIS — K219 Gastro-esophageal reflux disease without esophagitis: Secondary | ICD-10-CM | POA: Insufficient documentation

## 2024-04-01 DIAGNOSIS — Z79899 Other long term (current) drug therapy: Secondary | ICD-10-CM | POA: Diagnosis not present

## 2024-04-01 DIAGNOSIS — K641 Second degree hemorrhoids: Secondary | ICD-10-CM | POA: Insufficient documentation

## 2024-04-01 DIAGNOSIS — E039 Hypothyroidism, unspecified: Secondary | ICD-10-CM | POA: Insufficient documentation

## 2024-04-01 DIAGNOSIS — K635 Polyp of colon: Secondary | ICD-10-CM | POA: Insufficient documentation

## 2024-04-01 DIAGNOSIS — Z87891 Personal history of nicotine dependence: Secondary | ICD-10-CM | POA: Insufficient documentation

## 2024-04-01 DIAGNOSIS — Z1211 Encounter for screening for malignant neoplasm of colon: Secondary | ICD-10-CM | POA: Insufficient documentation

## 2024-04-01 DIAGNOSIS — I10 Essential (primary) hypertension: Secondary | ICD-10-CM | POA: Insufficient documentation

## 2024-04-01 DIAGNOSIS — K5730 Diverticulosis of large intestine without perforation or abscess without bleeding: Secondary | ICD-10-CM | POA: Diagnosis not present

## 2024-04-01 DIAGNOSIS — Z860100 Personal history of colon polyps, unspecified: Secondary | ICD-10-CM

## 2024-04-01 HISTORY — PX: POLYPECTOMY: SHX149

## 2024-04-01 HISTORY — PX: COLONOSCOPY: SHX5424

## 2024-04-01 LAB — GLUCOSE, CAPILLARY: Glucose-Capillary: 134 mg/dL — ABNORMAL HIGH (ref 70–99)

## 2024-04-01 SURGERY — COLONOSCOPY
Anesthesia: General

## 2024-04-01 MED ORDER — PROPOFOL 10 MG/ML IV BOLUS
INTRAVENOUS | Status: DC | PRN
  Administered 2024-04-01 (×2): 20 mg via INTRAVENOUS
  Administered 2024-04-01: 80 mg via INTRAVENOUS
  Administered 2024-04-01: 20 mg via INTRAVENOUS

## 2024-04-01 MED ORDER — SODIUM CHLORIDE 0.9 % IV SOLN: INTRAVENOUS | Status: DC

## 2024-04-01 MED ORDER — PROPOFOL 500 MG/50ML IV EMUL
INTRAVENOUS | Status: DC | PRN
  Administered 2024-04-01: 125 ug/kg/min via INTRAVENOUS

## 2024-04-01 NOTE — Anesthesia Preprocedure Evaluation (Signed)
 Anesthesia Evaluation  Patient identified by MRN, date of birth, ID band Patient awake    Reviewed: Allergy & Precautions, NPO status , Patient's Chart, lab work & pertinent test results  Airway Mallampati: III  TM Distance: >3 FB Neck ROM: full    Dental  (+) Teeth Intact   Pulmonary neg pulmonary ROS, Patient abstained from smoking., former smoker   Pulmonary exam normal  + decreased breath sounds      Cardiovascular Exercise Tolerance: Good hypertension, Pt. on medications negative cardio ROS Normal cardiovascular exam Rhythm:Regular Rate:Normal     Neuro/Psych negative neurological ROS  negative psych ROS   GI/Hepatic negative GI ROS, Neg liver ROS, hiatal hernia,GERD  Medicated,,  Endo/Other  negative endocrine ROSdiabetes, Type 2, Oral Hypoglycemic AgentsHypothyroidism  Class 4 obesity  Renal/GU negative Renal ROS  negative genitourinary   Musculoskeletal   Abdominal  (+) + obese  Peds negative pediatric ROS (+)  Hematology negative hematology ROS (+)   Anesthesia Other Findings Past Medical History: No date: Arthritis     Comment:  FEET No date: Diabetes mellitus without complication (HCC) No date: GERD (gastroesophageal reflux disease) No date: History of hiatal hernia     Comment:  SMALL PER PT No date: HIV (human immunodeficiency virus infection) (HCC) No date: Hypertension No date: Hypothyroidism No date: Overactive bladder 2007: Pneumonia  Past Surgical History: No date: BIOPSY THYROID  No date: BREAST SURGERY     Comment:  BIOPSY 2012: CARPAL TUNNEL RELEASE 2013: CERVIX SURGERY 2014: COLONOSCOPY     Comment:  polyps benign 2013-2014: CRYOTHERAPY 10/30/2017: CYSTOSCOPY     Comment:  Procedure: CYSTOSCOPY;  Surgeon: Arloa Lamar SQUIBB, MD;                Location: [facility] ORS;  Service: Gynecology;; 2011: JOINT REPLACEMENT; Left     Comment:  HIP 10/30/2017: LAPAROSCOPIC HYSTERECTOMY;  Bilateral     Comment:  Procedure: HYSTERECTOMY TOTAL LAPAROSCOPIC BILATERAL               SALPINGECTOMY;  Surgeon: Arloa Lamar SQUIBB, MD;  Location:              [facility] ORS;  Service: Gynecology;  Laterality: Bilateral;     Reproductive/Obstetrics negative OB ROS                              Anesthesia Physical Anesthesia Plan  ASA: 3  Anesthesia Plan: General   Post-op Pain Management:    Induction: Intravenous  PONV Risk Score and Plan: Propofol  infusion and TIVA  Airway Management Planned: Natural Airway and Nasal Cannula  Additional Equipment:   Intra-op Plan:   Post-operative Plan:   Informed Consent: I have reviewed the patients History and Physical, chart, labs and discussed the procedure including the risks, benefits and alternatives for the proposed anesthesia with the patient or authorized representative who has indicated his/her understanding and acceptance.     Dental Advisory Given  Plan Discussed with: CRNA  Anesthesia Plan Comments:         Anesthesia Quick Evaluation

## 2024-04-01 NOTE — Transfer of Care (Signed)
 Immediate Anesthesia Transfer of Care Note  Patient: Misty Larsen  Procedure(s) Performed: COLONOSCOPY POLYPECTOMY, INTESTINE  Patient Location: PACU  Anesthesia Type:General  Level of Consciousness: awake and drowsy  Airway & Oxygen Therapy: Patient Spontanous Breathing  Post-op Assessment: Report given to RN  Post vital signs: Reviewed and stable  Last Vitals:  Vitals Value Taken Time  BP 101/65 04/01/24 08:51  Temp 35.6 C 04/01/24 08:51  Pulse 87 04/01/24 08:51  Resp 22 04/01/24 08:51  SpO2 96 % 04/01/24 08:51  Vitals shown include unfiled device data.  Last Pain:  Vitals:   04/01/24 0851  TempSrc: Temporal  PainSc: 0-No pain         Complications: No notable events documented.

## 2024-04-01 NOTE — Op Note (Signed)
 [facility] Patient Name: Misty Larsen Procedure Date: 04/01/2024 8:16 AM MRN: [id number] Account #: [id number] Date of Birth: 1965/11/18 Admit Type: Outpatient Age: 58 Room: [facility] ENDO ROOM 4 Gender: Female Note Status: Finalized Instrument Name: Arvis [id number] Procedure:             Colonoscopy Indications:           Screening for colorectal malignant neoplasm due to                         positive Cologuard test Providers:             Rogelia Copping MD, MD Medicines:             Propofol  per Anesthesia Complications:         No immediate complications. Procedure:             Pre-Anesthesia Assessment:                        - Prior to the procedure, a History and Physical was                         performed, and patient medications and allergies were                         reviewed. The patient's tolerance of previous                         anesthesia was also reviewed. The risks and benefits                         of the procedure and the sedation options and risks                         were discussed with the patient. All questions were                         answered, and informed consent was obtained. Prior                         Anticoagulants: The patient has taken no anticoagulant                         or antiplatelet agents. ASA Grade Assessment: II - A                         patient with mild systemic disease. After reviewing                         the risks and benefits, the patient was deemed in                         satisfactory condition to undergo the procedure.                        After obtaining informed consent, the colonoscope was                         passed under direct vision. Throughout  the procedure,                         the patient's blood pressure, pulse, and oxygen                         saturations were monitored continuously. The                         Colonoscope was introduced through  the anus and                         advanced to the the cecum, identified by appendiceal                         orifice and ileocecal valve. The colonoscopy was                         performed without difficulty. The patient tolerated                         the procedure well. The quality of the bowel                         preparation was adequate to identify polyps. Findings:      The perianal and digital rectal examinations were normal.      Four sessile polyps were found in the transverse colon. The polyps were       4 to 6 mm in size. These polyps were removed with a cold snare.       Resection and retrieval were complete.      A 3 mm polyp was found in the sigmoid colon. The polyp was sessile. The       polyp was removed with a cold snare. Resection and retrieval were       complete.      Many small-mouthed diverticula were found in the sigmoid colon.      Non-bleeding internal hemorrhoids were found during retroflexion. The       hemorrhoids were Grade II (internal hemorrhoids that prolapse but reduce       spontaneously). Impression:            - Four 4 to 6 mm polyps in the transverse colon,                         removed with a cold snare. Resected and retrieved.                        - One 3 mm polyp in the sigmoid colon, removed with a                         cold snare. Resected and retrieved.                        - Diverticulosis in the sigmoid colon.                        - Non-bleeding internal hemorrhoids. Recommendation:        - Discharge patient to home.                        -  Resume previous diet.                        - Continue present medications.                        - Await pathology results.                        - Repeat colonoscopy in 3 years for surveillance. Procedure Code(s):     --- Professional ---                        [id number], Colonoscopy, flexible; with removal of                         tumor(s), polyp(s), or other lesion(s) by  snare                         technique Diagnosis Code(s):     --- Professional ---                        Z12.11, Encounter for screening for malignant neoplasm                         of colon                        D12.3, Benign neoplasm of transverse colon (hepatic                         flexure or splenic flexure) CPT copyright 2022 American Medical Association. All rights reserved. The codes documented in this report are preliminary and upon coder review may  be revised to meet current compliance requirements. Rogelia Copping MD, MD 04/01/2024 8:48:56 AM This report has been signed electronically. Number of Addenda: 0 Note Initiated On: 04/01/2024 8:16 AM Scope Withdrawal Time: 0 hours 9 minutes 44 seconds  Total Procedure Duration: 0 hours 12 minutes 38 seconds  Estimated Blood Loss:  Estimated blood loss: none.      [facility]

## 2024-04-01 NOTE — H&P (Signed)
 Rogelia Copping, MD [facility] [address], [address] [place], [place] [address] Phone:[phone] Fax : [phone]  Primary Care Physician:  Sharma Coyer, MD Primary Gastroenterologist:  Dr. Copping  Pre-Procedure History & Physical: HPI:  Misty Larsen is a 58 y.o. female is here for an colonoscopy.   Past Medical History:  Diagnosis Date   Arthritis    FEET   Diabetes mellitus without complication (HCC)    GERD (gastroesophageal reflux disease)    History of hiatal hernia    SMALL PER PT   HIV (human immunodeficiency virus infection) (HCC)    Hypertension    Hypothyroidism    Overactive bladder    Pneumonia 2007    Past Surgical History:  Procedure Laterality Date   BIOPSY THYROID      BREAST SURGERY     BIOPSY   CARPAL TUNNEL RELEASE  2012   CERVIX SURGERY  2013   COLONOSCOPY  2014   polyps benign   CRYOTHERAPY  2013-2014   CYSTOSCOPY  10/30/2017   Procedure: CYSTOSCOPY;  Surgeon: Arloa Lamar SQUIBB, MD;  Location: [facility] ORS;  Service: Gynecology;;   JOINT REPLACEMENT Left 2011   HIP   LAPAROSCOPIC HYSTERECTOMY Bilateral 10/30/2017   Procedure: HYSTERECTOMY TOTAL LAPAROSCOPIC BILATERAL SALPINGECTOMY;  Surgeon: Arloa Lamar SQUIBB, MD;  Location: [facility] ORS;  Service: Gynecology;  Laterality: Bilateral;    Prior to Admission medications   Medication Sig Start Date End Date Taking? Authorizing Provider  DOVATO  50-300 MG TABS Take 1 tablet by mouth daily. 12/10/18  Yes [provider]  levothyroxine  (SYNTHROID , LEVOTHROID) 50 MCG tablet Take 50 mcg by mouth daily before breakfast.   Yes [provider]  metoprolol  succinate (TOPROL -XL) 100 MG 24 hr tablet Take 100 mg by mouth daily. 12/29/19  Yes [provider]  rosuvastatin (CRESTOR) 10 MG tablet Take by mouth. 12/29/19  Yes [provider]  VEOZAH 45 MG TABS Take 45 mg by mouth. 12/05/23 01/12/25 Yes [provider]  metFORMIN  (GLUCOPHAGE ) 500 MG tablet Take 1 tablet (500 mg  total) by mouth 2 (two) times daily with a meal. 02/02/24   Simmons-Robinson, Makiera, MD  omeprazole  (PRILOSEC) 40 MG capsule Take 40 mg by mouth daily. 02/08/20   [provider]  tirzepatide  (MOUNJARO ) 15 MG/0.5ML Pen Inject 15 mg into the skin once a week. 02/02/24   Simmons-Robinson, Coyer, MD  VALACYCLOVIR HCL PO  12/05/23   [provider]    Allergies as of 02/04/2024 - Review Complete 02/02/2024  Allergen Reaction Noted   Sulfa antibiotics Other (See Comments) 06/09/2017    Family History  Problem Relation Age of Onset   Breast cancer Mother     Social History   Socioeconomic History   Marital status: Single    Spouse name: Not on file   Number of children: Not on file   Years of education: Not on file   Highest education level: 11th grade  Occupational History   Not on file  Tobacco Use   Smoking status: Former    Current packs/day: 0.00    Average packs/day: 0.5 packs/day for 41.0 years (20.5 ttl pk-yrs)    Types: Cigarettes    Start date: 12/30/1975    Quit date: 01/05/2017    Years since quitting: 7.2   Smokeless tobacco: Never  Vaping Use   Vaping status: Former  Substance and Sexual Activity   Alcohol use: Yes    Comment: Occasional   Drug use: No    Types: Crack cocaine  Comment: PT STATES SHE HAS BEEN CLEAN FOR 6 YEARS NOW    Sexual activity: Not Currently    Birth control/protection: Post-menopausal  Other Topics Concern   Not on file  Social History Narrative   Not on file   Social Drivers of Health   Financial Resource Strain: Low Risk  (01/26/2024)   Overall Financial Resource Strain (CARDIA)    Difficulty of Paying Living Expenses: Not hard at all  Food Insecurity: No Food Insecurity (01/26/2024)   Hunger Vital Sign    Worried About Running Out of Food in the Last Year: Never true    Ran Out of Food in the Last Year: Never true  Transportation Needs: No Transportation Needs (01/26/2024)   PRAPARE - Scientist, research (physical sciences) (Medical): No    Lack of Transportation (Non-Medical): No  Physical Activity: Unknown (01/26/2024)   Exercise Vital Sign    Days of Exercise per Week: 0 days    Minutes of Exercise per Session: Not on file  Stress: No Stress Concern Present (01/26/2024)   [employer] of Occupational Health - Occupational Stress Questionnaire    Feeling of Stress : Not at all  Social Connections: Socially Isolated (01/26/2024)   Social Connection and Isolation Panel    Frequency of Communication with Friends and Family: More than three times a week    Frequency of Social Gatherings with Friends and Family: Never    Attends Religious Services: Never    Database administrator or Organizations: No    Attends Engineer, structural: Not on file    Marital Status: Never married  Catering manager Violence: Not on file    Review of Systems: See HPI, otherwise negative ROS  Physical Exam: BP (!) (P) 145/83   Pulse (P) 83   Temp (!) (P) 96.2 F (35.7 C) (Temporal)   Resp (P) 16   LMP 07/04/2009   SpO2 (P) 96%  General:   Alert,  pleasant and cooperative in NAD Head:  Normocephalic and atraumatic. Neck:  Supple; no masses or thyromegaly. Lungs:  Clear throughout to auscultation.    Heart:  Regular rate and rhythm. Abdomen:  Soft, nontender and nondistended. Normal bowel sounds, without guarding, and without rebound.   Neurologic:  Alert and  oriented x4;  grossly normal neurologically.  Impression/Plan: Misty Larsen is here for an colonoscopy to be performed for screening  Risks, benefits, limitations, and alternatives regarding  colonoscopy have been reviewed with the patient.  Questions have been answered.  All parties agreeable.   Rogelia Copping, MD  04/01/2024, 8:15 AM

## 2024-04-01 NOTE — Anesthesia Postprocedure Evaluation (Signed)
 Anesthesia Post Note  Patient: Misty Larsen  Procedure(s) Performed: COLONOSCOPY POLYPECTOMY, INTESTINE  Patient location during evaluation: PACU Anesthesia Type: General Level of consciousness: awake Pain management: satisfactory to patient Vital Signs Assessment: post-procedure vital signs reviewed and stable Respiratory status: spontaneous breathing Cardiovascular status: stable Anesthetic complications: no   No notable events documented.   Last Vitals:  Vitals:   04/01/24 0911 04/01/24 0915  BP: (!) 134/112 115/72  Pulse: 82 80  Resp: (!) 24 18  Temp:    SpO2: 100% 100%    Last Pain:  Vitals:   04/01/24 0911  TempSrc:   PainSc: 0-No pain                 VAN STAVEREN,Bee Marchiano

## 2024-04-05 LAB — SURGICAL PATHOLOGY

## 2024-04-06 ENCOUNTER — Ambulatory Visit: Payer: Self-pay | Admitting: Gastroenterology

## 2024-04-09 ENCOUNTER — Other Ambulatory Visit: Payer: Self-pay | Admitting: Family Medicine

## 2024-04-21 ENCOUNTER — Encounter: Payer: Self-pay | Admitting: Family Medicine

## 2024-04-21 ENCOUNTER — Ambulatory Visit: Admitting: Family Medicine

## 2024-04-21 VITALS — BP 135/85 | HR 84 | Temp 97.9°F | Ht 67.0 in | Wt 271.3 lb

## 2024-04-21 DIAGNOSIS — E11649 Type 2 diabetes mellitus with hypoglycemia without coma: Secondary | ICD-10-CM

## 2024-04-21 DIAGNOSIS — R635 Abnormal weight gain: Secondary | ICD-10-CM

## 2024-04-21 DIAGNOSIS — E1165 Type 2 diabetes mellitus with hyperglycemia: Secondary | ICD-10-CM | POA: Diagnosis not present

## 2024-04-21 DIAGNOSIS — L659 Nonscarring hair loss, unspecified: Secondary | ICD-10-CM | POA: Diagnosis not present

## 2024-04-21 DIAGNOSIS — I10 Essential (primary) hypertension: Secondary | ICD-10-CM

## 2024-04-21 DIAGNOSIS — Z7984 Long term (current) use of oral hypoglycemic drugs: Secondary | ICD-10-CM

## 2024-04-21 DIAGNOSIS — R0683 Snoring: Secondary | ICD-10-CM

## 2024-04-21 DIAGNOSIS — H6992 Unspecified Eustachian tube disorder, left ear: Secondary | ICD-10-CM

## 2024-04-21 DIAGNOSIS — R5383 Other fatigue: Secondary | ICD-10-CM

## 2024-04-21 MED ORDER — METFORMIN HCL 1000 MG PO TABS: 1000.0000 mg | ORAL_TABLET | Freq: Two times a day (BID) | ORAL | 1 refills | Status: DC

## 2024-04-21 NOTE — Progress Notes (Signed)
 Established patient visit   Patient: Misty Larsen   DOB: 1965/10/29   58 y.o. Female  MRN: [id number] Visit Date: 04/21/2024  Today's healthcare provider: Rockie Agent, MD   Chief Complaint  Patient presents with   Referral    Patient requesting referral to dermatology for hair loss in crown of head  Discuss CT scan and colonoscopy results    Medication Problem    Patient would like to discuss switching from mounjaro  to a different med, states she has not lost any weight.    Cyst    Patient states she still has knot on r side, had mammo and US  and have not found anything    Subjective     HPI     Referral    Additional comments: Patient requesting referral to dermatology for hair loss in crown of head  Discuss CT scan and colonoscopy results         Medication Problem    Additional comments: Patient would like to discuss switching from mounjaro  to a different med, states she has not lost any weight.         Cyst    Additional comments: Patient states she still has knot on r side, had mammo and US  and have not found anything       Last edited by Cherry Chiquita HERO, CMA on 04/21/2024  8:21 AM.       Discussed the use of AI scribe software for clinical note transcription with the patient, who gave verbal consent to proceed.  History of Present Illness Misty Larsen is a 58 year old female who presents with dermatologic concerns and hair loss.  She is experiencing hair loss, which she suspects may be related to her thyroid  medication. She is frustrated with the condition, stating she is 'tired of wearing caps' and previously had dreadlocks. She is seeking a dermatology referral for further evaluation.  She mentions a history of fluid in her ear discovered during a visit to [facility] a few years ago. Recently, she experienced a cramp in her neck and occasional sharp pain when yawning, described as a 'sharp pain shoot from back here to up in here.' This has  been occurring for several years, primarily on one side.  She has been on Mounjaro  15 mg weekly for a year but has not experienced weight loss despite not eating much. She is considering switching to Zepbound , as she believes it may be more effective for weight loss. Her girlfriend has lost significant weight on a similar medication regimen. She is also taking Metformin  500 mg twice daily.  She underwent a colonoscopy where five polyps were found and removed, all of which were benign. A lung test showed an existing nodule increased in size to 7.8 millimeters, and two additional nodules were identified.  She reports a knot in her right upper quadrant that is concerning to her, although imaging has not revealed any abnormalities. She describes it as 'sore.'  She experiences significant fatigue, often falling asleep during the day, and has been told she might need a sleep apnea test. She also reports waking up frequently at night to use the bathroom.     Past Medical History:  Diagnosis Date   Allergy 2015   Arthritis    FEET   Diabetes mellitus without complication (HCC)    GERD (gastroesophageal reflux disease)    History of hiatal hernia    SMALL PER PT   HIV (human immunodeficiency virus infection) (  HCC)    Hypertension    Hypothyroidism    Overactive bladder    Pneumonia 2007    Medications: Outpatient Medications Prior to Visit  Medication Sig   DOVATO  50-300 MG TABS Take 1 tablet by mouth daily.   levothyroxine  (SYNTHROID , LEVOTHROID) 50 MCG tablet Take 50 mcg by mouth daily before breakfast.   metoprolol  succinate (TOPROL -XL) 100 MG 24 hr tablet Take 100 mg by mouth daily.   omeprazole  (PRILOSEC) 40 MG capsule Take 40 mg by mouth daily.   rosuvastatin (CRESTOR) 10 MG tablet Take by mouth.   tirzepatide  (MOUNJARO ) 15 MG/0.5ML Pen Inject 15 mg into the skin once a week.   VALACYCLOVIR HCL PO    VEOZAH 45 MG TABS Take 45 mg by mouth.   [DISCONTINUED] metFORMIN  (GLUCOPHAGE )  500 MG tablet Take 1 tablet (500 mg total) by mouth 2 (two) times daily with a meal.   [DISCONTINUED] metFORMIN  (GLUCOPHAGE -XR) 500 MG 24 hr tablet TAKE 1 TABLET BY MOUTH EVERY DAY WITH DINNER   No facility-administered medications prior to visit.    Review of Systems  Last metabolic panel Lab Results  Component Value Date   GLUCOSE 86 09/30/2020   NA 139 09/30/2020   K 4.1 09/30/2020   CL 104 09/30/2020   CO2 24 09/30/2020   BUN 16 09/30/2020   CREATININE 0.98 09/30/2020   GFRNONAA >60 09/30/2020   CALCIUM 9.0 09/30/2020   PROT 9.6 (H) 05/04/2020   ALBUMIN 4.6 05/04/2020   BILITOT 1.3 (H) 05/04/2020   ALKPHOS 60 05/04/2020   AST 20 05/04/2020   ALT 21 05/04/2020   ANIONGAP 11 09/30/2020   Last lipids No results found for: CHOL, HDL, LDLCALC, LDLDIRECT, TRIG, CHOLHDL Last hemoglobin A1c Lab Results  Component Value Date   HGBA1C 5.9 (H) 05/04/2020        Objective    BP 135/85 (BP Location: Left Arm, Patient Position: Sitting, Cuff Size: Normal)   Pulse 84   Temp 97.9 F (36.6 C) (Oral)   Ht 5' 7 (1.702 m)   Wt 271 lb 4.8 oz (123.1 kg)   LMP 07/04/2009   SpO2 98%   BMI 42.49 kg/m   BP Readings from Last 3 Encounters:  04/21/24 135/85  04/01/24 115/72  02/02/24 122/72   Wt Readings from Last 3 Encounters:  04/21/24 271 lb 4.8 oz (123.1 kg)  04/01/24 266 lb 12.8 oz (121 kg)  03/01/24 277 lb (125.6 kg)        Physical Exam  Physical Exam GENERAL: No acute distress. CARDIOVASCULAR: Regular rate and rhythm, no murmurs. ABDOMEN: Palpable non-tender area in right upper quadrant extending to flank, approximately 1 inch in diameter.    No results found for any visits on 04/21/24.   Assessment & Plan     Problem List Items Addressed This Visit       Cardiovascular and Mediastinum   Hypertension     Endocrine   Type 2 diabetes mellitus with hyperglycemia (HCC)   Relevant Medications   metFORMIN  (GLUCOPHAGE ) 1000 MG tablet    Other Relevant Orders   Hemoglobin A1c   Urine Albumin/Creatinine with ratio (send out) [LAB689]   Other Visit Diagnoses       Non-scarring hair loss    -  Primary   Relevant Orders   Ambulatory referral to Dermatology     Dysfunction of left eustachian tube         Weight gain       Relevant Orders  Cortisol     Uncontrolled type 2 diabetes mellitus with hypoglycemia, unspecified hypoglycemia coma status (HCC)       Relevant Medications   metFORMIN  (GLUCOPHAGE ) 1000 MG tablet     Fatigue, unspecified type       Relevant Orders   Vitamin B12   VITAMIN D  25 Hydroxy (Vit-D Deficiency, Fractures)   Nocturnal polysomnography (NPSG)     Snoring       Relevant Orders   Nocturnal polysomnography (NPSG)        Assessment & Plan Eustachian Tube Dysfunction Intermittent sharp pain from the neck to the ear, possibly related to eustachian tube dysfunction. No ear pain reported, but occasional discomfort when bending over. Concerned about potential serious underlying issues. Flonase nasal spray recommended as first-line treatment. Concern about nasal sprays causing strokes was addressed, reassuring that Flonase is not associated with stroke risk. - Recommend use of Flonase nasal spray as first-line treatment.  Hair Loss Chronic hair loss with unknown etiology. Suspects thyroid  medication may be a contributing factor. Seeking a dermatologist with experience in treating patients with similar hair and skin texture. [facility] dermatology referral for hair loss.  Weight Management  Type 2 DM (well controlled) On Mounjaro  for a year without significant weight loss. Interested in switching to Zepbound , but informed it is the same medication as Mounjaro . Discussed potential benefits of switching to [facility] or increasing metformin  dosage to aid weight loss. Cortisol levels to be checked to rule out other causes of weight retention. Not currently exercising regularly and reports fatigue, which may  contribute to weight retention. - Check cortisol levels. - Check A1c and metabolic panel. - Increase metformin  to 1000 mg twice daily if tolerated.  Diabetes Mellitus Diabetes management requires updating A1c and metabolic panel. Urine albumin to be checked. - Check urine albumin. - Update A1c and metabolic panel.  Fatigue Chronic fatigue with episodes of daytime sleepiness. Possible sleep apnea suspected. Reports poor sleep hygiene and frequent naps. Vitamin D  and B12 levels to be checked to rule out deficiencies. Sleep study recommended to evaluate for sleep apnea. - Order sleep study. - Check vitamin D  and B12 levels.  Lipoma Palpable, non-tender mass in the right upper quadrant, likely a lipoma. No abnormalities found on previous imaging. Concerned about the nature of the mass and its potential to become problematic.  General Health Maintenance Recent colonoscopy with benign polyps and lung test showing stable nodules. Breast imaging shows no evidence of cancer. Awaiting records for eye exam, mammogram, and Pap smear. Scheduled for a bone density test on August 18. - Request records for eye exam, mammogram, and Pap smear. - Schedule bone density test for August 18.  Follow-up Follow-up needed to review lab results and sleep study findings. - Schedule follow-up appointment in a little over a month.     Return in about 5 weeks (around 05/26/2024) for fatigue .      Total time spent on today's visit was 65 minutes, including both face-to-face time interviewing and examining the patient, reviewing medical record including labs/imaging/specialist notes, developing and discussing further evaluation,answering patient's questions, ordering referrals to specialists, coordinating follow up care in addition to documenting in the patient's chart.     Rockie Agent, MD  [facility] [phone] (phone) [phone] (fax)  [facility]

## 2024-04-22 ENCOUNTER — Encounter: Payer: Self-pay | Admitting: Family Medicine

## 2024-04-22 LAB — CORTISOL: Cortisol: 6.3 ug/dL (ref 6.2–19.4)

## 2024-04-22 LAB — MICROALBUMIN / CREATININE URINE RATIO
Creatinine, Urine: 92.6 mg/dL
Microalb/Creat Ratio: 17 mg/g{creat} (ref 0–29)
Microalbumin, Urine: 16.1 ug/mL

## 2024-04-22 LAB — HEMOGLOBIN A1C
Est. average glucose Bld gHb Est-mCnc: 123 mg/dL
Hgb A1c MFr Bld: 5.9 % — ABNORMAL HIGH (ref 4.8–5.6)

## 2024-04-22 LAB — VITAMIN B12: Vitamin B-12: 555 pg/mL (ref 232–1245)

## 2024-04-22 LAB — VITAMIN D 25 HYDROXY (VIT D DEFICIENCY, FRACTURES): Vit D, 25-Hydroxy: 9.6 ng/mL — ABNORMAL LOW (ref 30.0–100.0)

## 2024-04-27 ENCOUNTER — Ambulatory Visit: Payer: Self-pay | Admitting: Family Medicine

## 2024-04-27 DIAGNOSIS — E559 Vitamin D deficiency, unspecified: Secondary | ICD-10-CM

## 2024-04-27 MED ORDER — VITAMIN D (ERGOCALCIFEROL) 1.25 MG (50000 UNIT) PO CAPS: 50000.0000 [IU] | ORAL_CAPSULE | ORAL | Status: AC

## 2024-05-11 ENCOUNTER — Ambulatory Visit: Admitting: Podiatry

## 2024-05-18 ENCOUNTER — Ambulatory Visit (INDEPENDENT_AMBULATORY_CARE_PROVIDER_SITE_OTHER): Admitting: Podiatry

## 2024-05-18 ENCOUNTER — Encounter: Payer: Self-pay | Admitting: Podiatry

## 2024-05-18 VITALS — Ht 67.0 in | Wt 271.0 lb

## 2024-05-18 DIAGNOSIS — M79674 Pain in right toe(s): Secondary | ICD-10-CM | POA: Diagnosis not present

## 2024-05-18 DIAGNOSIS — M79675 Pain in left toe(s): Secondary | ICD-10-CM | POA: Diagnosis not present

## 2024-05-18 DIAGNOSIS — B351 Tinea unguium: Secondary | ICD-10-CM | POA: Diagnosis not present

## 2024-05-18 MED ORDER — TERBINAFINE HCL 250 MG PO TABS: 250.0000 mg | ORAL_TABLET | Freq: Every day | ORAL | 0 refills | Status: DC

## 2024-05-18 NOTE — Progress Notes (Signed)
   Chief Complaint  Patient presents with   Nail Problem    Pt is here for [facility].    SUBJECTIVE Patient presents to office today complaining of elongated, thickened nails that cause pain while ambulating in shoes.  Patient is unable to trim their own nails. Patient is here for further evaluation and treatment.  Past Medical History:  Diagnosis Date   Allergy 2015   Arthritis    FEET   Diabetes mellitus without complication (HCC)    GERD (gastroesophageal reflux disease)    History of hiatal hernia    SMALL PER PT   HIV (human immunodeficiency virus infection) (HCC)    Hypertension    Hypothyroidism    Overactive bladder    Pneumonia 2007    Allergies  Allergen Reactions   Sulfa Antibiotics Other (See Comments)    Unknown     OBJECTIVE General Patient is awake, alert, and oriented x 3 and in no acute distress. Derm Skin is dry and supple bilateral. Negative open lesions or macerations. Remaining integument unremarkable. Nails are tender, long, thickened and dystrophic with subungual debris, consistent with onychomycosis, 1-5 bilateral. No signs of infection noted. Vasc  DP and PT pedal pulses palpable bilaterally. Temperature gradient within normal limits.  Neuro Grossly intact via light touch Musculoskeletal Exam No symptomatic pedal deformities noted bilateral. Muscular strength within normal limits.  ASSESSMENT 1.  Pain due to onychomycosis of toenails both  PLAN OF CARE -Patient evaluated today.  -Prescription for Lamisil  200 mg #90 daily.  Hepatic function with with CMP on 04/07/2024 WNL -Instructed to maintain good pedal hygiene and foot care.  -Mechanical debridement of nails 1-5 bilaterally performed using a nail nipper. Filed with dremel without incident.  -Return to clinic PRN   Thresa EMERSON Sar, DPM [facility]  Dr. Thresa EMERSON Sar, DPM    [address] [place], [place] [address]                Office [phone]  Fax [phone]

## 2024-05-27 ENCOUNTER — Ambulatory Visit: Attending: Otolaryngology

## 2024-05-27 DIAGNOSIS — R0683 Snoring: Secondary | ICD-10-CM | POA: Diagnosis present

## 2024-05-27 DIAGNOSIS — R5383 Other fatigue: Secondary | ICD-10-CM | POA: Diagnosis not present

## 2024-05-27 DIAGNOSIS — G4733 Obstructive sleep apnea (adult) (pediatric): Secondary | ICD-10-CM | POA: Insufficient documentation

## 2024-06-07 ENCOUNTER — Ambulatory Visit: Admitting: Family Medicine

## 2024-06-07 ENCOUNTER — Encounter: Payer: Self-pay | Admitting: Family Medicine

## 2024-06-07 VITALS — BP 119/80 | HR 87 | Temp 97.9°F | Ht 67.0 in | Wt 264.4 lb

## 2024-06-07 DIAGNOSIS — E1165 Type 2 diabetes mellitus with hyperglycemia: Secondary | ICD-10-CM

## 2024-06-07 DIAGNOSIS — E785 Hyperlipidemia, unspecified: Secondary | ICD-10-CM | POA: Diagnosis not present

## 2024-06-07 DIAGNOSIS — E1159 Type 2 diabetes mellitus with other circulatory complications: Secondary | ICD-10-CM

## 2024-06-07 DIAGNOSIS — L97929 Non-pressure chronic ulcer of unspecified part of left lower leg with unspecified severity: Secondary | ICD-10-CM

## 2024-06-07 DIAGNOSIS — G479 Sleep disorder, unspecified: Secondary | ICD-10-CM

## 2024-06-07 DIAGNOSIS — E1169 Type 2 diabetes mellitus with other specified complication: Secondary | ICD-10-CM

## 2024-06-07 DIAGNOSIS — E041 Nontoxic single thyroid nodule: Secondary | ICD-10-CM

## 2024-06-07 DIAGNOSIS — R222 Localized swelling, mass and lump, trunk: Secondary | ICD-10-CM

## 2024-06-07 DIAGNOSIS — I152 Hypertension secondary to endocrine disorders: Secondary | ICD-10-CM

## 2024-06-07 DIAGNOSIS — Z780 Asymptomatic menopausal state: Secondary | ICD-10-CM

## 2024-06-07 DIAGNOSIS — F40240 Claustrophobia: Secondary | ICD-10-CM

## 2024-06-07 MED ORDER — GABAPENTIN 100 MG PO CAPS: 100.0000 mg | ORAL_CAPSULE | Freq: Every day | ORAL | 3 refills | Status: DC

## 2024-06-07 NOTE — Progress Notes (Signed)
 Established patient visit   Patient: Misty Larsen   DOB: Jan 04, 1966   58 y.o. Female  MRN: [id number] Visit Date: 06/07/2024  Today's healthcare provider: Rockie Agent, MD   Chief Complaint  Patient presents with   Medical Management of Chronic Issues    Patient presents for follow up of Dm, HTN and thyroid . Reports feeling well, states she believes she may have lost some weight. States she is drinking collagen supplement- says it is helping her with appetite and energy.    Subjective     HPI     Medical Management of Chronic Issues    Additional comments: Patient presents for follow up of Dm, HTN and thyroid . Reports feeling well, states she believes she may have lost some weight. States she is drinking collagen supplement- says it is helping her with appetite and energy.       Last edited by Cherry Chiquita HERO, CMA on 06/07/2024 10:28 AM.       Discussed the use of AI scribe software for clinical note transcription with the patient, who gave verbal consent to proceed.  History of Present Illness Misty Larsen is a 58 year old female with type 2 diabetes, hypercholesterolemia, and hypertension who presents for follow-up.  Her type 2 diabetes is managed with metformin  1000 mg twice daily and Mounjaro  15 mg weekly. Her last A1c was 5.9. She has lost weight, decreasing from 271 pounds in July to 264 pounds, attributing this to her medication regimen and dietary changes, including reduced portion sizes and healthier food choices. She also takes collagen supplements, which she feels have increased her energy levels and suppressed her appetite.  Her hypertension is managed with metoprolol  100 mg daily. She continues to take Crestor 10 mg daily for hyperlipidemia and Protonix  40 mg for chronic GERD.  She has a history of vitamin D  deficiency, with a last recorded level of 9.6, and is on 50,000 units of vitamin D  weekly. She also takes Feso 45 mg for vasomotor  symptoms.  She reports a thyroid  nodule for which she takes Synthroid  50 mcg daily. Her thyroid  function tests have been stable.  She describes palpable nodules beneath her breast and in the right upper quadrant, which were not visualized on previous imaging. She is concerned about these nodules, noting they feel like they are growing and cause soreness at times.  She underwent a sleep study recently but has not yet received the results. She describes difficulty sleeping, often waking up to use the bathroom, and sometimes taking naps during the day. She mentions a recent incident where she walked home after the sleep study, which resulted in foot pain.  She has a history of a left hip replacement and reports intermittent neuropathy in her left lower extremity, which she attributes to nerve issues from the surgery. She experiences sharp pains in her left foot occasionally, especially after prolonged activity.  She has a history of lung nodules and a soft tissue nodule in the left upper quadrant that has been present since 2021. No hemoptysis.  She mentions a history of cataracts in both eyes, which are mild, and she wears glasses. She recently had an eye exam that showed no diabetic retinopathy.     Past Medical History:  Diagnosis Date   Allergy 2015   Arthritis    FEET   Diabetes mellitus without complication (HCC)    GERD (gastroesophageal reflux disease)    History of hiatal hernia    SMALL PER  PT   HIV (human immunodeficiency virus infection) (HCC)    Hypertension    Hypothyroidism    Overactive bladder    Pneumonia 2007    Medications: Outpatient Medications Prior to Visit  Medication Sig   DOVATO  50-300 MG TABS Take 1 tablet by mouth daily.   levothyroxine  (SYNTHROID , LEVOTHROID) 50 MCG tablet Take 50 mcg by mouth daily before breakfast.   metFORMIN  (GLUCOPHAGE ) 1000 MG tablet Take 1 tablet (1,000 mg total) by mouth 2 (two) times daily with a meal.   metoprolol  succinate  (TOPROL -XL) 100 MG 24 hr tablet Take 100 mg by mouth daily.   omeprazole  (PRILOSEC) 40 MG capsule Take 40 mg by mouth daily.   rosuvastatin (CRESTOR) 10 MG tablet Take by mouth.   terbinafine  (LAMISIL ) 250 MG tablet Take 1 tablet (250 mg total) by mouth daily.   tirzepatide  (MOUNJARO ) 15 MG/0.5ML Pen Inject 15 mg into the skin once a week.   UNABLE TO FIND Take by mouth daily. Med Name: Collagen supplement liquid by mouth   VALACYCLOVIR HCL PO    VEOZAH 45 MG TABS Take 45 mg by mouth.   Vitamin D , Ergocalciferol , (DRISDOL ) 1.25 MG (50000 UNIT) CAPS capsule Take 1 capsule (50,000 Units total) by mouth every 7 (seven) days.   No facility-administered medications prior to visit.    Review of Systems  Last metabolic panel Lab Results  Component Value Date   GLUCOSE 86 09/30/2020   NA 139 09/30/2020   K 4.1 09/30/2020   CL 104 09/30/2020   CO2 24 09/30/2020   BUN 16 09/30/2020   CREATININE 0.98 09/30/2020   GFRNONAA >60 09/30/2020   CALCIUM 9.0 09/30/2020   PROT 9.6 (H) 05/04/2020   ALBUMIN 4.6 05/04/2020   BILITOT 1.3 (H) 05/04/2020   ALKPHOS 60 05/04/2020   AST 20 05/04/2020   ALT 21 05/04/2020   ANIONGAP 11 09/30/2020   Last lipids No results found for: CHOL, HDL, LDLCALC, LDLDIRECT, TRIG, CHOLHDL Last hemoglobin A1c Lab Results  Component Value Date   HGBA1C 5.9 (H) 04/21/2024   Last thyroid  functions No results found for: TSH, T3TOTAL, T4TOTAL, THYROIDAB Last vitamin D  Lab Results  Component Value Date   VD25OH 9.6 (L) 04/21/2024        Objective    BP 119/80 (BP Location: Left Arm, Patient Position: Sitting, Cuff Size: Normal)   Pulse 87   Temp 97.9 F (36.6 C) (Oral)   Ht 5' 7 (1.702 m)   Wt 264 lb 6.4 oz (119.9 kg)   LMP 07/04/2009   SpO2 97%   BMI 41.41 kg/m   BP Readings from Last 3 Encounters:  06/07/24 119/80  04/21/24 135/85  04/01/24 115/72   Wt Readings from Last 3 Encounters:  06/07/24 264 lb 6.4 oz (119.9 kg)   05/18/24 271 lb (122.9 kg)  04/21/24 271 lb 4.8 oz (123.1 kg)        Physical Exam  Physical Exam VITALS: BP- 119/80 MEASUREMENTS: Weight- 264. CHEST: Clear to auscultation bilaterally, no wheezes or crackles. CARDIOVASCULAR: Normal heart rate and rhythm, S1 and S2 normal without murmurs. ABDOMEN: Firm nodules palpated in right upper quadrant, left upper quadrant inframammary fold, and along posterior right inferior ribs with tenderness on palpation.    No results found for any visits on 06/07/24.  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 The condition is currently in the prediabetes range, suggesting remission. - Continue metformin  1000 mg twice daily - Continue Mounjaro  15 mg weekly  Obesity, BMI 41 Associated with DM, Snoring Obesity is being managed with Mounjaro  and lifestyle modifications. She has experienced weight loss, with a reduction from 271 pounds to 264 pounds since July. - Continue Mounjaro  15 mg weekly  Hypertension, well controlled Chronic  Hypertension is well controlled with a blood pressure reading of 119/80. The condition is managed with metoprolol . - Continue metoprolol  100 mg daily  Hyperlipidemia Chronic  Hyperlipidemia is being managed with Crestor. - Continue  Crestor 10 mg daily  Chronic gastroesophageal reflux disease (GERD) Chronic GERD is being managed with omeprazole  - Continue omeprazole  40 mg daily prn  Nontoxic single thyroid  nodule, left side Nontoxic single thyroid  nodule on the left side is being monitored. She is on Synthroid , and previous evaluations have shown stable thyroid  function. 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 The nodules are firm and tender, with some increase in size noted. - Order ultrasound of the upper abdomen to evaluate nodules  Vitamin D  deficiency Vitamin D  deficiency is being treated with high-dose vitamin D  supplementation. The last vitamin D  level was severely low at 9.6. - Continue vitamin D  50,000 units weekly  Vasomotor symptoms of menopause (hot flashes) Vasomotor symptoms of menopause are being managed with Feso. 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 The pain is intermittent and has been present since the surgery. - Prescribe Neurontin  100 mg at bedtime for neuropathic pain     Return in about 4 months (around 10/07/2024) for AWV, CPE.         Rockie Agent, MD  [facility] [phone] (phone) [phone] (fax)  [facility]

## 2024-06-07 NOTE — Patient Instructions (Signed)
 [facility] [address] [place],  [place]  [address] Main: [phone]

## 2024-06-09 ENCOUNTER — Encounter: Payer: Self-pay | Admitting: Family Medicine

## 2024-06-11 ENCOUNTER — Encounter: Payer: Self-pay | Admitting: Family Medicine

## 2024-06-11 ENCOUNTER — Ambulatory Visit: Payer: Self-pay | Admitting: Family Medicine

## 2024-06-11 DIAGNOSIS — G4733 Obstructive sleep apnea (adult) (pediatric): Secondary | ICD-10-CM

## 2024-06-11 NOTE — Telephone Encounter (Signed)
 Copied from CRM #[id number]. Topic: Clinical - Lab/Test Results >> Jun 11, 2024  3:38 PM Misty Larsen wrote: Reason for CRM: pt called back in today about the sleep study and agreed to have the follow up testing done. Please advise pt

## 2024-06-17 ENCOUNTER — Other Ambulatory Visit: Payer: Self-pay | Admitting: Family Medicine

## 2024-06-21 ENCOUNTER — Ambulatory Visit
Admission: RE | Admit: 2024-06-21 | Discharge: 2024-06-21 | Disposition: A | Discharge status: Home / Self Care | Source: Ambulatory Visit | Attending: Family Medicine | Admitting: Family Medicine

## 2024-06-21 ENCOUNTER — Other Ambulatory Visit: Payer: Self-pay | Admitting: Family Medicine

## 2024-06-21 DIAGNOSIS — E1169 Type 2 diabetes mellitus with other specified complication: Secondary | ICD-10-CM

## 2024-06-21 DIAGNOSIS — L97929 Non-pressure chronic ulcer of unspecified part of left lower leg with unspecified severity: Secondary | ICD-10-CM

## 2024-06-21 DIAGNOSIS — G479 Sleep disorder, unspecified: Secondary | ICD-10-CM

## 2024-06-21 DIAGNOSIS — E041 Nontoxic single thyroid nodule: Secondary | ICD-10-CM

## 2024-06-21 DIAGNOSIS — F40240 Claustrophobia: Secondary | ICD-10-CM

## 2024-06-21 DIAGNOSIS — R222 Localized swelling, mass and lump, trunk: Secondary | ICD-10-CM | POA: Insufficient documentation

## 2024-06-21 DIAGNOSIS — Z780 Asymptomatic menopausal state: Secondary | ICD-10-CM

## 2024-06-21 DIAGNOSIS — I152 Hypertension secondary to endocrine disorders: Secondary | ICD-10-CM

## 2024-06-21 DIAGNOSIS — E1165 Type 2 diabetes mellitus with hyperglycemia: Secondary | ICD-10-CM

## 2024-06-25 ENCOUNTER — Ambulatory Visit: Payer: Self-pay | Admitting: Family Medicine

## 2024-06-29 ENCOUNTER — Ambulatory Visit (INDEPENDENT_AMBULATORY_CARE_PROVIDER_SITE_OTHER): Admitting: Podiatry

## 2024-06-29 ENCOUNTER — Encounter: Payer: Self-pay | Admitting: Podiatry

## 2024-06-29 VITALS — Ht 67.0 in | Wt 264.4 lb

## 2024-06-29 DIAGNOSIS — M19072 Primary osteoarthritis, left ankle and foot: Secondary | ICD-10-CM

## 2024-06-29 NOTE — Progress Notes (Signed)
   Chief Complaint  Patient presents with   Foot Pain    Pt is here due to left foot pain, states she has been here before for the same issue, she states the pain comes and goes and that it has a mind of its own.    HPI: 58 y.o. female presenting today for evaluation of left midtarsal joint pain.  History of dropping an object on her left foot while working.  This was several months ago.  She continues to have some pain and tenderness to the area  Past Medical History:  Diagnosis Date   Allergy 2015   Arthritis    FEET   Diabetes mellitus without complication (HCC)    GERD (gastroesophageal reflux disease)    History of hiatal hernia    SMALL PER PT   HIV (human immunodeficiency virus infection) (HCC)    Hypertension    Hypothyroidism    Overactive bladder    Pneumonia 2007    Past Surgical History:  Procedure Laterality Date   ABDOMINAL HYSTERECTOMY  2019   BIOPSY THYROID      BREAST SURGERY     BIOPSY   CARPAL TUNNEL RELEASE  2012   CERVIX SURGERY  2013   COLONOSCOPY  2014   polyps benign   COLONOSCOPY N/A 04/01/2024   Procedure: COLONOSCOPY;  Surgeon: Jinny Carmine, MD;  Location: [facility] ENDOSCOPY;  Service: Endoscopy;  Laterality: N/A;   CRYOTHERAPY  2013-2014   CYSTOSCOPY  10/30/2017   Procedure: CYSTOSCOPY;  Surgeon: Arloa Lamar SQUIBB, MD;  Location: [facility] ORS;  Service: Gynecology;;   JOINT REPLACEMENT Left 2011   HIP   LAPAROSCOPIC HYSTERECTOMY Bilateral 10/30/2017   Procedure: HYSTERECTOMY TOTAL LAPAROSCOPIC BILATERAL SALPINGECTOMY;  Surgeon: Arloa Lamar SQUIBB, MD;  Location: [facility] ORS;  Service: Gynecology;  Laterality: Bilateral;   POLYPECTOMY  04/01/2024   Procedure: POLYPECTOMY, INTESTINE;  Surgeon: Jinny Carmine, MD;  Location: [facility] ENDOSCOPY;  Service: Endoscopy;;    Allergies  Allergen Reactions   Sulfa Antibiotics Other (See Comments)    Unknown     Physical Exam: General: The patient is alert and oriented x3 in no acute distress.  Dermatology: Skin is  warm, dry and supple bilateral lower extremities.   Vascular: Palpable pedal pulses bilaterally. Capillary refill within normal limits.  No appreciable edema.  No erythema.  Neurological: Grossly intact via light touch  Musculoskeletal Exam: Tenderness with palpation noted to the left midtarsal joint   Assessment/Plan of Care: 1.  Arthritis left midtarsal joint  -Patient evaluated -Declined cortisone injection due to fear of needles -Continue wearing good supportive tennis shoes and sneakers.  Refrain from going barefoot -Return to clinic PRN       Thresa EMERSON Sar, DPM [facility]  Dr. Thresa EMERSON Sar, DPM    [address] [place], [place] [address]                Office [phone]  Fax [phone]

## 2024-07-01 ENCOUNTER — Other Ambulatory Visit

## 2024-07-01 ENCOUNTER — Encounter: Payer: Self-pay | Admitting: Family Medicine

## 2024-07-01 DIAGNOSIS — G4733 Obstructive sleep apnea (adult) (pediatric): Secondary | ICD-10-CM

## 2024-07-01 DIAGNOSIS — R0683 Snoring: Secondary | ICD-10-CM

## 2024-07-10 ENCOUNTER — Encounter: Payer: Self-pay | Admitting: Family Medicine

## 2024-07-10 ENCOUNTER — Other Ambulatory Visit: Payer: Self-pay | Admitting: Family Medicine

## 2024-07-10 DIAGNOSIS — E11649 Type 2 diabetes mellitus with hypoglycemia without coma: Secondary | ICD-10-CM

## 2024-07-12 ENCOUNTER — Other Ambulatory Visit: Payer: Self-pay | Admitting: Family Medicine

## 2024-07-12 DIAGNOSIS — E11649 Type 2 diabetes mellitus with hypoglycemia without coma: Secondary | ICD-10-CM

## 2024-07-12 MED ORDER — METFORMIN HCL 1000 MG PO TABS: 1000.0000 mg | ORAL_TABLET | Freq: Two times a day (BID) | ORAL | 1 refills | Status: AC

## 2024-07-12 NOTE — Telephone Encounter (Unsigned)
 Copied from CRM [id number]. Topic: Clinical - Medication Refill >> Jul 12, 2024  8:09 AM Emylou G wrote: Medication: metFORMIN  (GLUCOPHAGE ) 1000 MG tablet  ** said she ran out quickly doesn't know why.. but she does need the 1000 mg twice a day one tablet **  [facility] 1000[facility]    Has the patient contacted their pharmacy? Yes (Agent: If no, request that the patient contact the pharmacy for the refill. If patient does not wish to contact the pharmacy document the reason why and proceed with request.) (Agent: If yes, when and what did the pharmacy advise?) said to call us   This is the patient's preferred pharmacy:  [id number] [place], [place] - [address] [place] [address] Phone: [phone] Fax: [phone]  Is this the correct pharmacy for this prescription? Yes If no, delete pharmacy and type the correct one.   Has the prescription been filled recently? No  Is the patient out of the medication? Yes  Has the patient been seen for an appointment in the last year OR does the patient have an upcoming appointment? Yes  Can we respond through MyChart? Yes  Agent: Please be advised that Rx refills may take up to 3 business days. We ask that you follow-up with your pharmacy.

## 2024-07-16 ENCOUNTER — Other Ambulatory Visit: Payer: Self-pay | Admitting: Family Medicine

## 2024-07-19 ENCOUNTER — Ambulatory Visit: Admitting: Sleep Medicine

## 2024-07-19 ENCOUNTER — Encounter: Payer: Self-pay | Admitting: Family Medicine

## 2024-07-19 ENCOUNTER — Encounter: Payer: Self-pay | Admitting: Sleep Medicine

## 2024-07-19 VITALS — BP 112/70 | HR 87 | Temp 97.6°F | Ht 67.0 in | Wt 265.6 lb

## 2024-07-19 DIAGNOSIS — Z6841 Body Mass Index (BMI) 40.0 and over, adult: Secondary | ICD-10-CM

## 2024-07-19 DIAGNOSIS — G4733 Obstructive sleep apnea (adult) (pediatric): Secondary | ICD-10-CM

## 2024-07-19 DIAGNOSIS — I10 Essential (primary) hypertension: Secondary | ICD-10-CM | POA: Diagnosis not present

## 2024-07-19 NOTE — Progress Notes (Signed)
 Name:Misty Larsen MRN: [id number] DOB: 04/10/1966   CHIEF COMPLAINT:  EXCESSIVE DAYTIME SLEEPINESS   HISTORY OF PRESENT ILLNESS: Misty Larsen is a 58 y.o. w/ a h/o prediabetes, hyperlipidemia, morbid obesity and HTN who present for c/o loud snoring and excessive daytime sleepiness which has been present for 1-2 years. Reports nocturnal awakenings due to nocturia, however does not have difficulty falling back to sleep. Reports significant weight changes. Denies morning headaches, RLS symptoms, dream enactment, cataplexy, hypnagogic or hypnapompic hallucinations. Denies a family history of sleep apnea. Denies drowsy driving. Drinks 1 latte daily, denies alcohol, tobacco or illicit drug use.   The patient underwent in lab PSG which revealed moderate OSA (AHI 18, O2 nadir 78%).   Bedtime 12-1 am Sleep onset 10 mins Rise time 8 am   PAST MEDICAL HISTORY :   has a past medical history of Allergy (2015), Arthritis (2015), Diabetes mellitus without complication (HCC) (2019), GERD (gastroesophageal reflux disease) (2004), History of hiatal hernia, HIV (human immunodeficiency virus infection) (HCC), Hypertension (2000), Hypothyroidism, Overactive bladder, and Pneumonia (2007).  has a past surgical history that includes Cryotherapy (2013-2014); Colonoscopy (2014); Joint replacement (Left, 2011); Breast surgery (2010); Carpal tunnel release (2012); Cervix surgery (2013); Biopsy thyroid ; Laparoscopic hysterectomy (Bilateral, 10/30/2017); Cystoscopy (10/30/2017); Colonoscopy (N/A, 04/01/2024); Polypectomy (04/01/2024); and Abdominal hysterectomy (2019). Prior to Admission medications   Medication Sig Start Date End Date Taking? Authorizing Provider  DOVATO  50-300 MG TABS Take 1 tablet by mouth daily. 12/10/18   [provider]  levothyroxine  (SYNTHROID ) 50 MCG tablet TAKE 1 TABLET (50 MCG TOTAL) BY MOUTH EVERY MORNING BEFORE BREAKFAST (0630) 06/18/24   Simmons-Robinson, Rockie, MD   metFORMIN  (GLUCOPHAGE ) 1000 MG tablet Take 1 tablet (1,000 mg total) by mouth 2 (two) times daily with a meal. 07/12/24   Simmons-Robinson, Makiera, MD  metoprolol  succinate (TOPROL -XL) 100 MG 24 hr tablet Take 100 mg by mouth daily. 12/29/19   [provider]  omeprazole  (PRILOSEC) 40 MG capsule Take 40 mg by mouth daily. 02/08/20   [provider]  rosuvastatin (CRESTOR) 10 MG tablet Take by mouth. 12/29/19   [provider]  terbinafine  (LAMISIL ) 250 MG tablet Take 1 tablet (250 mg total) by mouth daily. 05/18/24   Janit Thresa HERO, DPM  tirzepatide  (MOUNJARO ) 15 MG/0.5ML Pen Inject 15 mg into the skin once a week. 02/02/24   Simmons-Robinson, Rockie, MD  VALACYCLOVIR HCL PO  12/05/23   [provider]  VEOZAH 45 MG TABS Take 45 mg by mouth. 12/05/23 01/12/25  [provider]  Vitamin D , Ergocalciferol , (DRISDOL ) 1.25 MG (50000 UNIT) CAPS capsule Take 1 capsule (50,000 Units total) by mouth every 7 (seven) days. 04/27/24   Simmons-Robinson, Rockie, MD   Allergies  Allergen Reactions   Sulfa Antibiotics Other (See Comments)    Unknown    FAMILY HISTORY:  family history includes Breast cancer in her mother; Cancer in her sister; Diabetes in her mother. SOCIAL HISTORY:  reports that she quit smoking about 7 years ago. Her smoking use included cigarettes. She started smoking about 48 years ago. She has a 20.5 pack-year smoking history. She has never used smokeless tobacco. She reports current alcohol use. She reports that she does not use drugs.   Review of Systems:  Gen:  Denies  fever, sweats, chills weight loss  HEENT: Denies blurred vision, double vision, ear pain, eye pain, hearing loss, nose bleeds, sore throat Cardiac:  No dizziness, chest pain or heaviness, chest tightness,edema, No JVD  Resp:   No cough, -sputum production, -shortness of breath,-wheezing, -hemoptysis,  Gi: Denies swallowing difficulty, stomach pain, nausea or vomiting, diarrhea,  constipation, bowel incontinence Gu:  Denies bladder incontinence, burning urine Ext:   Denies Joint pain, stiffness or swelling Skin: Denies  skin rash, easy bruising or bleeding or hives Endoc:  Denies polyuria, polydipsia , polyphagia or weight change Psych:   Denies depression, insomnia or hallucinations  Other:  All other systems negative  VITAL SIGNS: BP 112/70   Pulse 87   Temp 97.6 F (36.4 C)   Ht 5' 7 (1.702 m)   Wt 265 lb 9.6 oz (120.5 kg)   LMP 07/04/2009   SpO2 97%   BMI 41.60 kg/m    Physical Examination:   General Appearance: No distress  EYES PERRLA, EOM intact.   NECK Supple, No JVD Pulmonary: normal breath sounds, No wheezing.  CardiovascularNormal S1,S2.  No m/r/g.   Abdomen: Benign, Soft, non-tender. Skin:   warm, no rashes, no ecchymosis  Extremities: normal, no cyanosis, clubbing. Neuro:without focal findings,  speech normal  PSYCHIATRIC: Mood, affect within normal limits.   ASSESSMENT AND PLAN  OSA Reviewed PSG results with patient. Starting on APAP therapy set to 4-16 cm H2O. Discussed the consequences of untreated sleep apnea. Advised not to drive drowsy for safety of patient and others. Will follow up in 3 months.    HTN Stable, on current management. Following with PCP.   Morbid obesity Counseled patient on diet and lifestyle modification.    Patient  satisfied with Plan of action and management. All questions answered  I spent a total of 56 minutes reviewing chart data, face-to-face evaluation with the patient, counseling and coordination of care as detailed above.    Ridge Lafond, M.D.  Sleep Medicine [facility]

## 2024-07-19 NOTE — Patient Instructions (Addendum)

## 2024-07-21 ENCOUNTER — Ambulatory Visit
Admission: RE | Admit: 2024-07-21 | Discharge: 2024-07-21 | Disposition: A | Discharge status: Home / Self Care | Source: Ambulatory Visit | Attending: Family Medicine | Admitting: Family Medicine

## 2024-07-21 DIAGNOSIS — Z780 Asymptomatic menopausal state: Secondary | ICD-10-CM | POA: Insufficient documentation

## 2024-07-23 ENCOUNTER — Ambulatory Visit: Payer: Self-pay | Admitting: Family Medicine

## 2024-07-26 ENCOUNTER — Telehealth: Payer: Self-pay

## 2024-07-26 NOTE — Telephone Encounter (Signed)
 CPAP order was sent to [facility] on 07/19/2024.   LMTCB. E2C2 please advise when patient calls back.

## 2024-07-26 NOTE — Telephone Encounter (Signed)
 Copied from CRM [id number]. Topic: Clinical - Order For Equipment >> Jul 23, 2024  3:25 PM Misty Larsen wrote: Reason for CRM: Patient calling to check on order.  Stated Dr Jess was going to have her use the nose/head gear Where did the order go? Who will be reaching out to her? When will they call her?  Please call patient @ [phone]

## 2024-07-29 NOTE — Telephone Encounter (Signed)
 Sounds good. You are due 08/31/2024. If you would like to check your schedule and message me a day/time using mychart, we can do that too. Whatever is easier.   Talk soon! Isaiah, RN

## 2024-08-01 ENCOUNTER — Other Ambulatory Visit: Payer: Self-pay | Admitting: Family Medicine

## 2024-08-11 ENCOUNTER — Ambulatory Visit

## 2024-08-12 ENCOUNTER — Other Ambulatory Visit: Payer: Self-pay | Admitting: Podiatry

## 2024-08-16 ENCOUNTER — Other Ambulatory Visit: Payer: Self-pay | Admitting: Podiatry

## 2024-08-16 DIAGNOSIS — Z79899 Other long term (current) drug therapy: Secondary | ICD-10-CM

## 2024-08-16 NOTE — Progress Notes (Signed)
 Updated LFT needed prior to refill lamisil . Order placed.   Thresa EMERSON Sar, DPM [facility]  Dr. Thresa EMERSON Sar, DPM    [address] [place], [place] [address]                Office [phone]  Fax [phone]

## 2024-09-02 ENCOUNTER — Other Ambulatory Visit: Payer: Self-pay | Admitting: Family Medicine

## 2024-09-02 DIAGNOSIS — E7800 Pure hypercholesterolemia, unspecified: Secondary | ICD-10-CM

## 2024-09-03 ENCOUNTER — Encounter: Payer: Self-pay | Admitting: Family Medicine

## 2024-09-06 ENCOUNTER — Ambulatory Visit
Admission: RE | Admit: 2024-09-06 | Discharge: 2024-09-06 | Disposition: A | Source: Ambulatory Visit | Attending: Acute Care | Admitting: Acute Care

## 2024-09-06 DIAGNOSIS — Z87891 Personal history of nicotine dependence: Secondary | ICD-10-CM

## 2024-09-06 DIAGNOSIS — R911 Solitary pulmonary nodule: Secondary | ICD-10-CM

## 2024-09-13 ENCOUNTER — Encounter: Payer: Self-pay | Admitting: Family Medicine

## 2024-09-14 ENCOUNTER — Other Ambulatory Visit: Payer: Self-pay

## 2024-09-14 DIAGNOSIS — Z122 Encounter for screening for malignant neoplasm of respiratory organs: Secondary | ICD-10-CM

## 2024-09-14 DIAGNOSIS — Z87891 Personal history of nicotine dependence: Secondary | ICD-10-CM

## 2024-09-17 ENCOUNTER — Encounter: Payer: Self-pay | Admitting: Family Medicine

## 2024-09-19 ENCOUNTER — Other Ambulatory Visit: Payer: Self-pay | Admitting: Family Medicine

## 2024-09-20 ENCOUNTER — Telehealth: Payer: Self-pay

## 2024-09-20 ENCOUNTER — Telehealth: Payer: Self-pay | Admitting: Family Medicine

## 2024-09-20 DIAGNOSIS — E7800 Pure hypercholesterolemia, unspecified: Secondary | ICD-10-CM

## 2024-09-20 MED ORDER — ROSUVASTATIN CALCIUM 10 MG PO TABS: 10.0000 mg | ORAL_TABLET | Freq: Every day | ORAL | 3 refills | Status: AC

## 2024-09-20 NOTE — Telephone Encounter (Unsigned)
 Copied from CRM #[id number]. Topic: Clinical - Medication Refill >> Sep 20, 2024 11:37 AM Cynthia K wrote: Medication:  VALACYCLOVIR HCL PO  Has the patient contacted their pharmacy? Yes (Agent: If no, request that the patient contact the pharmacy for the refill. If patient does not wish to contact the pharmacy document the reason why and proceed with request.) (Agent: If yes, when and what did the pharmacy advise?) Pharmacy needs order to refill  This is the patient's preferred pharmacy:  [id number] [place], [place] - [address] [address] Phone: [phone] Fax: [phone]  Is this the correct pharmacy for this prescription? Yes If no, delete pharmacy and type the correct one.   Has the prescription been filled recently? No  Is the patient out of the medication? Yes - She ran out this morning.   Has the patient been seen for an appointment in the last year OR does the patient have an upcoming appointment? Yes  Can we respond through MyChart? Yes  Agent: Please be advised that Rx refills may take up to 3 business days. We ask that you follow-up with your pharmacy.

## 2024-09-20 NOTE — Telephone Encounter (Signed)
 Please advise

## 2024-09-20 NOTE — Telephone Encounter (Signed)
 Copied from CRM #[id number]. Topic: Clinical - Medication Question >> Sep 20, 2024 11:34 AM Montie POUR wrote: Reason for CRM:  Tyshell would like to know if Dr. Sharma would increase her rosuvastatin  (CRESTOR ) 10 MG tablet to 1 tablet per day instead of 1/2 tablet. Please call Samanthia at  [phone] to discuss.  Thanks

## 2024-09-20 NOTE — Telephone Encounter (Signed)
 Prescription updated to 10 mg daily.

## 2024-09-20 NOTE — Telephone Encounter (Signed)
 Unable to pend

## 2024-09-20 NOTE — Addendum Note (Signed)
 Addended by: SIMMONS-ROBINSON, Dona Walby L on: 09/20/2024 12:37 PM   Modules accepted: Orders

## 2024-09-21 NOTE — Telephone Encounter (Signed)
Rx has been sent in. 

## 2024-09-23 ENCOUNTER — Encounter: Payer: Self-pay | Admitting: Podiatry

## 2024-09-26 ENCOUNTER — Encounter: Payer: Self-pay | Admitting: Family Medicine

## 2024-10-05 ENCOUNTER — Ambulatory Visit (INDEPENDENT_AMBULATORY_CARE_PROVIDER_SITE_OTHER): Admitting: Podiatry

## 2024-10-05 DIAGNOSIS — M19072 Primary osteoarthritis, left ankle and foot: Secondary | ICD-10-CM

## 2024-10-05 DIAGNOSIS — M79674 Pain in right toe(s): Secondary | ICD-10-CM

## 2024-10-05 DIAGNOSIS — B351 Tinea unguium: Secondary | ICD-10-CM | POA: Diagnosis not present

## 2024-10-05 DIAGNOSIS — M79675 Pain in left toe(s): Secondary | ICD-10-CM

## 2024-10-05 MED ORDER — TERBINAFINE HCL 250 MG PO TABS: 250.0000 mg | ORAL_TABLET | Freq: Every day | ORAL | 0 refills | Status: AC

## 2024-10-05 MED ORDER — ALPRAZOLAM 0.5 MG PO TABS: 0.5000 mg | ORAL_TABLET | Freq: Every day | ORAL | 0 refills | Status: AC | PRN

## 2024-10-05 NOTE — Progress Notes (Signed)
" ° °  Chief Complaint  Patient presents with   Foot Pain    Left foot issues same as before.  Arthritis left midtarsal joint and RFC    SUBJECTIVE Patient presents to office today complaining of elongated, thickened nails that cause pain while ambulating in shoes.  Patient is unable to trim their own nails. Patient is here for further evaluation and treatment.  Past Medical History:  Diagnosis Date   Allergy 2015   Arthritis 2015   FEET   Diabetes mellitus without complication (HCC) 2019   GERD (gastroesophageal reflux disease) 2004   History of hiatal hernia    SMALL PER PT   HIV (human immunodeficiency virus infection) (HCC)    Hypertension 2000   Hypothyroidism    Overactive bladder    Pneumonia 2007    Allergies[1]   OBJECTIVE General Patient is awake, alert, and oriented x 3 and in no acute distress. Derm Skin is dry and supple bilateral. Negative open lesions or macerations. Remaining integument unremarkable. Nails are tender, long, thickened and dystrophic with subungual debris, consistent with onychomycosis, 1-5 bilateral. No signs of infection noted. Vasc  DP and PT pedal pulses palpable bilaterally. Temperature gradient within normal limits.  Neuro Epicritic and protective threshold sensation grossly intact bilaterally.  Musculoskeletal Exam No symptomatic pedal deformities noted bilateral. Muscular strength within normal limits.  ASSESSMENT 1.  Pain due to onychomycosis of toenails both 2.  Midfoot arthritis left  PLAN OF CARE -Patient evaluated today.  -Instructed to maintain good pedal hygiene and foot care.  -Mechanical debridement of nails 1-5 bilaterally performed using a nail nipper. Filed with dremel without incident.  -Patient continues to have pain and tenderness associated to the left midfoot.  I do believe that cortisone injection may help potentially alleviate a lot of the patient's symptoms and pain.  She does have significant anxiety with injections  and is requesting antianxiety medication prior to the injection -Prescription for Xanax  0.5 mg #1 sent to the pharmacy to take just prior to presentation her next appointment.  She would like to reschedule for injection -Return to clinic for cortisone injection left midfoot   Thresa EMERSON Sar, DPM [facility]  Dr. Thresa EMERSON Sar, DPM    [address] [place], [place] [address]                Office [phone]  Fax [phone]         [1]  Allergies Allergen Reactions   Sulfa Antibiotics Other (See Comments)    Unknown   "

## 2024-10-06 ENCOUNTER — Ambulatory Visit

## 2024-10-06 VITALS — BP 132/74 | Ht 67.0 in | Wt 261.1 lb

## 2024-10-06 DIAGNOSIS — Z0000 Encounter for general adult medical examination without abnormal findings: Secondary | ICD-10-CM | POA: Diagnosis not present

## 2024-10-06 DIAGNOSIS — Z1231 Encounter for screening mammogram for malignant neoplasm of breast: Secondary | ICD-10-CM | POA: Diagnosis not present

## 2024-10-06 NOTE — Patient Instructions (Addendum)
 Misty Larsen,  Thank you for taking the time for your Medicare Wellness Visit. I appreciate your continued commitment to your health goals. Please review the care plan we discussed, and feel free to reach out if I can assist you further.  Please note that Annual Wellness Visits do not include a physical exam. Some assessments may be limited, especially if the visit was conducted virtually. If needed, we may recommend an in-person follow-up with your provider.  Ongoing Care Seeing your primary care provider every 3 to 6 months helps us  monitor your health and provide consistent, personalized care. APPT FOR PHYSICAL 10/11/24 @ 10:20 AM W/ DR.SIMMONS-ROBINSON  Referrals If a referral was made during today's visit and you haven't received any updates within two weeks, please contact the referred provider directly to check on the status.  Recommended Screenings:  Health Maintenance  Topic Date Due   COVID-19 Vaccine (1) Never done   Zoster (Shingles) Vaccine (1 of 2) Never done   Pap with HPV screening  Never done   Breast Cancer Screening  Never done   Yearly kidney function blood test for diabetes  09/30/2021   Complete foot exam   04/27/2023   Eye exam for diabetics  07/16/2023   Flu Shot  12/28/2024*   DTaP/Tdap/Td vaccine (1 - Tdap) 06/07/2025*   Pneumococcal Vaccine for age over 108 (1 of 2 - PCV) 06/07/2025*   Hepatitis B Vaccine (1 of 3 - Risk 3-dose series) 06/07/2025*   Hepatitis C Screening  06/07/2025*   Hemoglobin A1C  10/22/2024   Yearly kidney health urinalysis for diabetes  04/21/2025   Screening for Lung Cancer  09/06/2025   Medicare Annual Wellness Visit  10/06/2025   Colon Cancer Screening  04/02/2027   HIV Screening  Completed   HPV Vaccine  Aged Out   Meningitis B Vaccine  Aged Out  *Topic was postponed. The date shown is not the original due date.     Vision: Annual vision screenings are recommended for early detection of glaucoma, cataracts, and diabetic  retinopathy. These exams can also reveal signs of chronic conditions such as diabetes and high blood pressure.  Dental: Annual dental screenings help detect early signs of oral cancer, gum disease, and other conditions linked to overall health, including heart disease and diabetes.  Please see the attached documents for additional preventive care recommendations.   NEXT AWV 10/12/25 @ 1:10 PM IN PERSON

## 2024-10-06 NOTE — Progress Notes (Signed)
 "  No chief complaint on file.    Subjective:   Misty Larsen is a 59 y.o. female who presents for a Medicare Annual Wellness Visit.  Visit info / Clinical Intake: Living arrangements:: (!) (Patient-Rptd) lives alone Patient's Overall Health Status Rating: (Patient-Rptd) good Typical amount of pain: (Patient-Rptd) none Does pain affect daily life?: (Patient-Rptd) no  Dietary Habits and Nutritional Risks How many meals a day?: (Patient-Rptd) 3 Eats fruit and vegetables daily?: (!) (Patient-Rptd) no Most meals are obtained by: (Patient-Rptd) preparing own meals  Functional Status Activities of Daily Living (to include ambulation/medication): (Patient-Rptd) Independent Ambulation: (Patient-Rptd) Independent Medication Administration: (Patient-Rptd) Independent Home Management (perform basic housework or laundry): (Patient-Rptd) Independent Manage your own finances?: (Patient-Rptd) yes Primary transportation is: (Patient-Rptd) facility / other  Fall Screening Falls in the past year?: (Patient-Rptd) 0 Number of falls in past year: 0 Was there an injury with Fall?: 0 Fall Risk Category Calculator: 0 Patient Fall Risk Level: Low Fall Risk  Fall Risk Patient at Risk for Falls Due to: No Fall Risks Fall risk Follow up: Falls evaluation completed  Home and Transportation Safety: All rugs have non-skid backing?: (Patient-Rptd) yes All stairs or steps have railings?: (Patient-Rptd) yes Grab bars in the bathtub or shower?: (!) (Patient-Rptd) no Have non-skid surface in bathtub or shower?: (!) (Patient-Rptd) no Good home lighting?: (Patient-Rptd) yes Regular seat belt use?: (Patient-Rptd) yes Hospital stays in the last year:: (Patient-Rptd) no  Cognitive Assessment Difficulty concentrating, remembering, or making decisions? : (Patient-Rptd) no  Advance Directives (For Healthcare) Does Patient Have a Medical Advance Directive?: No Would patient like information on creating a  medical advance directive?: No - Patient declined    Allergies (verified) Sulfa antibiotics   Current Medications (verified) Outpatient Encounter Medications as of 10/06/2024  Medication Sig   ALPRAZolam  (XANAX ) 0.5 MG tablet Take 1 tablet (0.5 mg total) by mouth daily as needed for anxiety.   DOVATO  50-300 MG TABS Take 1 tablet by mouth daily.   levothyroxine  (SYNTHROID ) 50 MCG tablet TAKE 1 TABLET (50 MCG TOTAL) BY MOUTH EVERY MORNING BEFORE BREAKFAST (0630)   losartan  (COZAAR ) 50 MG tablet TAKE 1 TABLET BY MOUTH EVERY DAY   metFORMIN  (GLUCOPHAGE ) 1000 MG tablet Take 1 tablet (1,000 mg total) by mouth 2 (two) times daily with a meal.   metoprolol  succinate (TOPROL -XL) 100 MG 24 hr tablet TAKE 1 TABLET BY MOUTH EVERY DAY   omeprazole  (PRILOSEC) 40 MG capsule Take 40 mg by mouth daily.   rosuvastatin  (CRESTOR ) 10 MG tablet Take 1 tablet (10 mg total) by mouth daily.   terbinafine  (LAMISIL ) 250 MG tablet Take 1 tablet (250 mg total) by mouth daily.   tirzepatide  (MOUNJARO ) 15 MG/0.5ML Pen Inject 15 mg into the skin once a week.   valACYclovir (VALTREX) 500 MG tablet TAKE 1 TABLET BY MOUTH 2 (TWO) TIMES DAILY   VALACYCLOVIR HCL PO    VEOZAH 45 MG TABS Take 45 mg by mouth.   Vitamin D , Ergocalciferol , (DRISDOL ) 1.25 MG (50000 UNIT) CAPS capsule Take 1 capsule (50,000 Units total) by mouth every 7 (seven) days.   No facility-administered encounter medications on file as of 10/06/2024.    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   Diabetes mellitus without complication (HCC) 2019   GERD (gastroesophageal reflux disease) 2004   History of hiatal hernia    SMALL PER PT   HIV (human immunodeficiency virus infection) (HCC)  Hypertension 2000   Hypothyroidism    Overactive bladder    Pneumonia 2007   Past Surgical History:  Procedure Laterality Date   ABDOMINAL HYSTERECTOMY  2019   BIOPSY THYROID      BREAST SURGERY  2010   BIOPSY    CARPAL TUNNEL RELEASE  2012   CERVIX SURGERY  2013   COLONOSCOPY  2014   polyps benign   COLONOSCOPY N/A 04/01/2024   Procedure: COLONOSCOPY;  Surgeon: Jinny Carmine, MD;  Location[facility] ENDOSCOPY;  Service: Endoscopy;  Laterality: N/A;   CRYOTHERAPY  2013-2014   CYSTOSCOPY  10/30/2017   Procedure: CYSTOSCOPY;  Surgeon: Arloa Lamar SQUIBB, MD;  Location: [facility] ORS;  Service: Gynecology;;   JOINT REPLACEMENT Left 2011   HIP   LAPAROSCOPIC HYSTERECTOMY Bilateral 10/30/2017   Procedure: HYSTERECTOMY TOTAL LAPAROSCOPIC BILATERAL SALPINGECTOMY;  Surgeon: Arloa Lamar SQUIBB, MD;  Location: [facility] ORS;  Service: Gynecology;  Laterality: Bilateral;   POLYPECTOMY  04/01/2024   Procedure: POLYPECTOMY, INTESTINE;  Surgeon: Jinny Carmine, MD;  Location: [facility] ENDOSCOPY;  Service: Endoscopy;;   Family History  Problem Relation Age of Onset   Breast cancer Mother    Diabetes Mother    Cancer Sister    Social History   Occupational History   Not on file  Tobacco Use   Smoking status: Former    Current packs/day: 0.00    Average packs/day: 0.5 packs/day for 41.0 years (20.5 ttl pk-yrs)    Types: Cigarettes    Start date: 12/30/1975    Quit date: 01/05/2017    Years since quitting: 7.7   Smokeless tobacco: Never  Vaping Use   Vaping status: Former  Substance and Sexual Activity   Alcohol use: Yes    Comment: Occasional   Drug use: No    Types: Crack cocaine    Comment: PT STATES SHE HAS BEEN CLEAN FOR 6 YEARS NOW    Sexual activity: Not Currently    Birth control/protection: Post-menopausal   Tobacco Counseling Counseling given: Not Answered  SDOH Screenings   Food Insecurity: No Food Insecurity (10/02/2024)  Housing: Low Risk (10/02/2024)  Transportation Needs: No Transportation Needs (10/02/2024)  Utilities: Not At Risk (12/05/2023)   Received from [facility]  Alcohol Screen: Low Risk (10/02/2024)  Depression (PHQ2-9): Low Risk (06/07/2024)  Financial Resource Strain: Low Risk  (10/02/2024)  Physical Activity: Inactive (10/02/2024)  Social Connections: Socially Isolated (10/02/2024)  Stress: No Stress Concern Present (10/02/2024)  Tobacco Use: Medium Risk (09/02/2024)   Received from [facility]   See flowsheets for full screening details  Depression Screen PHQ 2 & 9 Depression Scale- Over the past 2 weeks, how often have you been bothered by any of the following problems? Little interest or pleasure in doing things: 0 Feeling down, depressed, or hopeless (PHQ Adolescent also includes...irritable): 0 PHQ-2 Total Score: 0 Trouble falling or staying asleep, or sleeping too much: 0 Feeling tired or having little energy: 0 Poor appetite or overeating (PHQ Adolescent also includes...weight loss): 0 Feeling bad about yourself - or that you are a failure or have let yourself or your family down: 0 Trouble concentrating on things, such as reading the newspaper or watching television (PHQ Adolescent also includes...like school work): 0 Moving or speaking so slowly that other people could have noticed. Or the opposite - being so fidgety or restless that you have been moving around a lot more than usual: 0 Thoughts that you would be better off dead, or of hurting yourself in  some way: 0 PHQ-9 Total Score: 0 If you checked off any problems, how difficult have these problems made it for you to do your work, take care of things at home, or get along with other people?: Not difficult at all     Goals Addressed   None          Objective:    There were no vitals filed for this visit. There is no height or weight on file to calculate BMI.  Hearing/Vision screen No results found. Immunizations and Health Maintenance Health Maintenance  Topic Date Due   Medicare Annual Wellness (AWV)  Never done   COVID-19 Vaccine (1) Never done   Zoster Vaccines- Shingrix (1 of 2) Never done   Cervical Cancer Screening (HPV/Pap Cotest)  Never done   Mammogram  Never done    Diabetic kidney evaluation - eGFR measurement  09/30/2021   FOOT EXAM  04/27/2023   OPHTHALMOLOGY EXAM  07/16/2023   Influenza Vaccine  12/28/2024 (Originally 04/30/2024)   DTaP/Tdap/Td (1 - Tdap) 06/07/2025 (Originally 07/25/1983)   Pneumococcal Vaccine: 50+ Years (1 of 2 - PCV) 06/07/2025 (Originally 07/25/1983)   Hepatitis B Vaccines 19-59 Average Risk (1 of 3 - Risk 3-dose series) 06/07/2025 (Originally 07/24/2024)   Hepatitis C Screening  06/07/2025 (Originally 07/24/1982)   HEMOGLOBIN A1C  10/22/2024   Diabetic kidney evaluation - Urine ACR  04/21/2025   Lung Cancer Screening  09/06/2025   Colonoscopy  04/02/2027   HIV Screening  Completed   HPV VACCINES  Aged Out   Meningococcal B Vaccine  Aged Out        Assessment/Plan:  This is a routine wellness examination for Misty Larsen.  Patient Care Team: Sharma Coyer, MD as PCP - General (Family Medicine) Bordelon, Therisa Cohn, MD as Referring Physician (Ophthalmology) Janit Thresa HERO, DPM as Consulting Physician (Podiatry)  I have personally reviewed and noted the following in the patients chart:   Medical and social history Use of alcohol, tobacco or illicit drugs  Current medications and supplements including opioid prescriptions. Functional ability and status Nutritional status Physical activity Advanced directives List of other physicians Hospitalizations, surgeries, and ER visits in previous 12 months Vitals Screenings to include cognitive, depression, and falls Referrals and appointments  No orders of the defined types were placed in this encounter.  In addition, I have reviewed and discussed with patient certain preventive protocols, quality metrics, and best practice recommendations. A written personalized care plan for preventive services as well as general preventive health recommendations were provided to patient.   Misty GORMAN Das, LPN   05/01/7972   No follow-ups on file.  After Visit Summary: (In  Person-Declined) Patient declined AVS at this time.  Nurse Notes: DECLINES ALL VACCINES; MAMMOGRAM ORDERED; UTD COLONOSCOPY   "

## 2024-10-08 ENCOUNTER — Telehealth: Payer: Self-pay

## 2024-10-08 ENCOUNTER — Inpatient Hospital Stay
Admission: RE | Admit: 2024-10-08 | Discharge: 2024-10-08 | Disposition: A | Payer: Self-pay | Source: Ambulatory Visit | Attending: Family Medicine

## 2024-10-08 ENCOUNTER — Other Ambulatory Visit: Payer: Self-pay | Admitting: *Deleted

## 2024-10-08 DIAGNOSIS — Z1231 Encounter for screening mammogram for malignant neoplasm of breast: Secondary | ICD-10-CM

## 2024-10-08 NOTE — Telephone Encounter (Signed)
 PA request received for Alprazolam  0.5 mg tablet. PA submitted through covermymeds and waiting on response.  Misty Larsen  (Key: [id number]) PA Case ID #: [id number] Rx #: [id number]

## 2024-10-08 NOTE — Telephone Encounter (Signed)
 PA was approved.

## 2024-10-09 NOTE — Progress Notes (Unsigned)
 4  Subjective:   Misty Larsen is a 59 y.o. female who presents for a Medicare Annual Wellness Visit.  Allergies (verified) Sulfa antibiotics   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   Diabetes mellitus without complication (HCC) 2019   GERD (gastroesophageal reflux disease) 2004   History of hiatal hernia    SMALL PER PT   HIV (human immunodeficiency virus infection) (HCC)    Hypertension 2000   Hypothyroidism    Overactive bladder    Pneumonia 2007   Past Surgical History:  Procedure Laterality Date   ABDOMINAL HYSTERECTOMY  2019   BIOPSY THYROID      BREAST SURGERY  2010   BIOPSY   CARPAL TUNNEL RELEASE  2012   CERVIX SURGERY  2013   COLONOSCOPY  2014   polyps benign   COLONOSCOPY N/A 04/01/2024   Procedure: COLONOSCOPY;  Surgeon: Jinny Carmine, MD;  Location: [facility];  Service: Endoscopy;  Laterality: N/A;   CRYOTHERAPY  2013-2014   CYSTOSCOPY  10/30/2017   Procedure: CYSTOSCOPY;  Surgeon: Arloa Lamar SQUIBB, MD;  Location: [facility];  Service: Gynecology;;   JOINT REPLACEMENT Left 2011   HIP   LAPAROSCOPIC HYSTERECTOMY Bilateral 10/30/2017   Procedure: HYSTERECTOMY TOTAL LAPAROSCOPIC BILATERAL SALPINGECTOMY;  Surgeon: Arloa Lamar SQUIBB, MD;  Location: [facility];  Service: Gynecology;  Laterality: Bilateral;   POLYPECTOMY  04/01/2024   Procedure: POLYPECTOMY, INTESTINE;  Surgeon: Jinny Carmine, MD;  Location: [facility];  Service: Endoscopy;;   Family History  Problem Relation Age of Onset   Breast cancer Mother    Diabetes Mother    Cancer Sister    Social History   Occupational History   Not on file  Tobacco Use   Smoking status: Former    Current packs/day: 0.00    Average packs/day: 0.5 packs/day for 42.0 years (21.0 ttl pk-yrs)    Types: Cigarettes    Start date: 12/30/1975    Quit date: 01/05/2017    Years since quitting: 7.7   Smokeless tobacco: Never   Tobacco comments:    I been cigarette  free 7 years.  Vaping Use   Vaping status: Former  Substance and Sexual Activity   Alcohol use: Not Currently    Comment: I only drink on occasions.  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   Sexual activity: Not Currently    Birth control/protection: Post-menopausal, None   Tobacco Counseling Counseling given: Not Answered Tobacco comments: I been cigarette free 7 years.  SDOH Screenings   Food Insecurity: No Food Insecurity (10/06/2024)  Housing: Unknown (10/06/2024)  Transportation Needs: No Transportation Needs (10/06/2024)  Utilities: Not At Risk (10/06/2024)  Alcohol Screen: Low Risk (10/02/2024)  Depression (PHQ2-9): Low Risk (10/11/2024)  Financial Resource Strain: Low Risk (10/02/2024)  Physical Activity: Insufficiently Active (10/06/2024)  Social Connections: Socially Isolated (10/06/2024)  Stress: No Stress Concern Present (10/06/2024)  Tobacco Use: Medium Risk (10/11/2024)  Health Literacy: Adequate Health Literacy (10/06/2024)   Depression Screen    10/11/2024   10:16 AM 10/06/2024    3:00 PM 06/07/2024   10:31 AM 01/02/2018    3:13 PM  PHQ 2/9 Scores  PHQ - 2 Score 0 0 0 0  PHQ- 9 Score 0 0 0       Data saved with a previous flowsheet row definition     Goals Addressed  None    Visit info / Clinical Intake: Medicare Wellness Visit Type:: Initial Annual Wellness Visit Persons participating in visit and providing information:: patient Medicare Wellness Visit Mode:: In-person (required for WTM) Interpreter Needed?: No Pre-visit prep was completed: yes AWV questionnaire completed by patient prior to visit?: yes Date:: 10/02/24 Living arrangements:: (!) (Patient-Rptd) lives alone Patient's Overall Health Status Rating: (Patient-Rptd) good Typical amount of pain: (Patient-Rptd) none Does pain affect daily life?: (Patient-Rptd) no Are you currently prescribed opioids?: no  Dietary Habits and Nutritional Risks How many  meals a day?: 3 (SMALL MEALS) Eats fruit and vegetables daily?: (!) no (OCCASIONALLY HAS BROCCOLI, BLUEBERRIES) Most meals are obtained by: (Patient-Rptd) preparing own meals In the last 2 weeks, have you had any of the following?: none Diabetic:: (!) yes Any non-healing wounds?: no How often do you check your BS?: 0 Would you like to be referred to a Nutritionist or for Diabetic Management? : no  Functional Status Activities of Daily Living (to include ambulation/medication): (Patient-Rptd) Independent Ambulation: (Patient-Rptd) Independent Medication Administration: (Patient-Rptd) Independent Home Management (perform basic housework or laundry): (Patient-Rptd) Independent Manage your own finances?: (Patient-Rptd) yes Primary transportation is: facility / other ([facility] OR [facility]) Concerns about vision?: no *vision screening is required for WTM* (WEARS GLASSES ALL DAY- DR[place]BORDERLON IN [place]) Concerns about hearing?: no  Fall Screening Falls in the past year?: 0 Number of falls in past year: 0 Was there an injury with Fall?: 0 Fall Risk Category Calculator: 0 Patient Fall Risk Level: Low Fall Risk  Fall Risk Patient at Risk for Falls Due to: No Fall Risks Fall risk Follow up: Falls evaluation completed  Home and Transportation Safety: All rugs have non-skid backing?: (Patient-Rptd) yes All stairs or steps have railings?: (Patient-Rptd) yes Grab bars in the bathtub or shower?: (!) (Patient-Rptd) no Have non-skid surface in bathtub or shower?: (!) (Patient-Rptd) no Good home lighting?: (Patient-Rptd) yes Regular seat belt use?: (Patient-Rptd) yes Hospital stays in the last year:: (Patient-Rptd) no  Cognitive Assessment Difficulty concentrating, remembering, or making decisions? : no (MAKES LISTS) Will 6CIT or Mini Cog be Completed: yes What year is it?: 0 points What month is it?: 0 points Give patient an address phrase to remember (5 components): [address]  , [place] About what time is it?: 0 points Count backwards from 20 to 1: 0 points Say the months of the year in reverse: 0 points Repeat the address phrase from earlier: 0 points 6 CIT Score: 0 points  Advance Directives (For Healthcare) Does Patient Have a Medical Advance Directive?: No Would patient like information on creating a medical advance directive?: No - Patient declined  Reviewed/Updated  Reviewed/Updated: Reviewed All (Medical, Surgical, Family, Medications, Allergies, Care Teams, Patient Goals)   Discussed the use of AI scribe software for clinical note transcription with the patient, who gave verbal consent to proceed.  History of Present Illness Misty Larsen is a 59 year old female who presents for an annual wellness visit.  She has class three obesity with a BMI of 40, down from 42, and has been actively working on weight loss. Her weight decreased from 261 pounds to 255 pounds since her last visit. She attributes this to dietary changes, including eating oatmeal, drinking Atkins cold lattes, pomegranate juice, water, and reducing beef intake. Her diet now includes chicken noodle soup, salads with grilled chicken, and occasionally beef, chicken, shrimp, and fish.  Her type two diabetes is well-managed with an A1c of 5.9% at the last  check. She takes Crestor  10 mg once daily at night.  She has a history of sleep apnea and reports significant improvement in her sleep quality since starting CPAP therapy. She uses a nasal pillow mask and experiences minimal apnea events. She still experiences some daytime sleepiness, particularly after meals, but overall feels less tired.  She experiences vasomotor symptoms due to menopause and takes Veozah, which has reduced the frequency of her hot flashes significantly. She still experiences occasional hot flashes.  She reports a persistent knot in her side that causes pain radiating to her back, which sometimes limits her arm movement.  The pain is intermittent and can be severe, but it resolves spontaneously. She has been using naproxen for pain relief.  She has a history of GERD and takes omeprazole  for management. She also has a known hernia, which she believes contributes to her acid reflux symptoms.  Her mammogram is scheduled for next month, and her lung cancer screening was completed in December 2025. She sees a podiatrist regularly.    10/25/24    Objective:    Today's Vitals   10/11/24 1009  BP: 124/82  Pulse: 97  SpO2: 100%  Weight: 261 lb 3.2 oz (118.5 kg)  Height: 5' 7 (1.702 m)   Body mass index is 40.91 kg/m.  Current Medications (verified) Outpatient Encounter Medications as of 10/11/2024  Medication Sig   acyclovir (ZOVIRAX) 800 MG tablet Take 800 mg by mouth 2 (two) times daily.   ALPRAZolam  (XANAX ) 0.5 MG tablet Take 1 tablet (0.5 mg total) by mouth daily as needed for anxiety.   DOVATO  50-300 MG TABS Take 1 tablet by mouth daily.   levothyroxine  (SYNTHROID ) 50 MCG tablet TAKE 1 TABLET (50 MCG TOTAL) BY MOUTH EVERY MORNING BEFORE BREAKFAST (0630)   losartan  (COZAAR ) 50 MG tablet TAKE 1 TABLET BY MOUTH EVERY DAY   metFORMIN  (GLUCOPHAGE ) 1000 MG tablet Take 1 tablet (1,000 mg total) by mouth 2 (two) times daily with a meal.   metoprolol  succinate (TOPROL -XL) 100 MG 24 hr tablet TAKE 1 TABLET BY MOUTH EVERY DAY   omeprazole  (PRILOSEC) 40 MG capsule Take 40 mg by mouth daily.   rosuvastatin  (CRESTOR ) 10 MG tablet Take 1 tablet (10 mg total) by mouth daily.   tirzepatide  (MOUNJARO ) 15 MG/0.5ML Pen Inject 15 mg into the skin once a week.   tiZANidine  (ZANAFLEX ) 4 MG tablet Take 1 tablet (4 mg total) by mouth every 6 (six) hours as needed for muscle spasms.   VEOZAH 45 MG TABS Take 45 mg by mouth.   Vitamin D -Vitamin K (VITAMIN K2-VITAMIN D3 PO) Take by mouth daily.   terbinafine  (LAMISIL ) 250 MG tablet Take 1 tablet (250 mg total) by mouth daily. (Patient not taking: Reported on 10/11/2024)   Vitamin  D, Ergocalciferol , (DRISDOL ) 1.25 MG (50000 UNIT) CAPS capsule Take 1 capsule (50,000 Units total) by mouth every 7 (seven) days. (Patient not taking: Reported on 10/11/2024)   [DISCONTINUED] valACYclovir (VALTREX) 500 MG tablet TAKE 1 TABLET BY MOUTH 2 (TWO) TIMES DAILY (Patient not taking: Reported on 10/11/2024)   [DISCONTINUED] VALACYCLOVIR HCL PO  (Patient not taking: Reported on 10/11/2024)   No facility-administered encounter medications on file as of 10/11/2024.   Hearing/Vision screen No results found. Immunizations and Health Maintenance Health Maintenance  Topic Date Due   COVID-19 Vaccine (1) Never done   Zoster Vaccines- Shingrix (1 of 2) Never done   Cervical Cancer Screening (HPV/Pap Cotest)  Never done   Mammogram  Never done  Diabetic kidney evaluation - eGFR measurement  09/30/2021   FOOT EXAM  04/27/2023   OPHTHALMOLOGY EXAM  07/16/2023   Influenza Vaccine  12/28/2024 (Originally 04/30/2024)   DTaP/Tdap/Td (1 - Tdap) 06/07/2025 (Originally 07/25/1983)   Pneumococcal Vaccine: 50+ Years (1 of 2 - PCV) 06/07/2025 (Originally 07/25/1983)   Hepatitis B Vaccines 19-59 Average Risk (1 of 3 - Risk 3-dose series) 06/07/2025 (Originally 07/24/2024)   Hepatitis C Screening  06/07/2025 (Originally 07/24/1982)   HEMOGLOBIN A1C  04/10/2025   Diabetic kidney evaluation - Urine ACR  04/21/2025   Lung Cancer Screening  09/06/2025   Medicare Annual Wellness (AWV)  10/11/2025   Colonoscopy  04/02/2027   HIV Screening  Completed   HPV VACCINES  Aged Out   Meningococcal B Vaccine  Aged Out    Physical Exam Vitals reviewed.  Constitutional:      General: She is not in acute distress.    Appearance: Normal appearance. She is not ill-appearing.  Cardiovascular:     Rate and Rhythm: Normal rate and regular rhythm.  Pulmonary:     Effort: Pulmonary effort is normal. No respiratory distress.     Breath sounds: No wheezing, rhonchi or rales.  Abdominal:     Hernia: A hernia is present.  Hernia is present in the ventral area.   Musculoskeletal:       Back:  Neurological:     Mental Status: She is alert and oriented to person, place, and time.  Psychiatric:        Mood and Affect: Mood normal.        Behavior: Behavior normal.         Assessment/Plan:  This is a routine wellness examination for [place].  Patient Care Team: Sharma Coyer, MD as PCP - General (Family Medicine) Bordelon, Therisa Cohn, MD as Referring Physician (Ophthalmology) Janit Thresa HERO, DPM as Consulting Physician (Podiatry)  I have personally reviewed and noted the following in the patients chart:   Medical and social history Use of alcohol, tobacco or illicit drugs  Current medications and supplements including opioid prescriptions. Functional ability and status Nutritional status Physical activity Advanced directives List of other physicians Hospitalizations, surgeries, and ER visits in previous 12 months Vitals Screenings to include cognitive, depression, and falls Referrals and appointments  Assessment and Plan Assessment & Plan Adult Wellness Visit Annual wellness visit conducted. Weight decreased from 265 lbs in October to 261 lbs currently, with BMI reduced from 42 to 40. Dietary changes include increased intake of oatmeal, cold latte, pomegranate juice, and reduced fried foods. Blood pressure is well-controlled. Recent sleep study improved sleep quality with CPAP therapy. Reports occasional daytime sleepiness, particularly after meals. Veozah has significantly reduced hot flashes. - Ordered renal puncture panel, A1c, and vitamin D  levels - Recommended Shingrix vaccine - Recommended COVID vaccine - Scheduled cervical cancer screening - Confirmed mammogram scheduled for next month  Type 2 diabetes mellitus Chronic  A1c was 5.9 at last visit, down to 5.3% today. Current glucose levels are stable. No recent blood work available for A1c. - Ordered point of care A1c  test  Hypertension Blood pressure is well-controlled with current medication regimen.  Hypercholesterolemia Currently on Crestor  10 mg daily. Recent blood work did not include cholesterol levels. - Ordered cholesterol panel  Class III obesity BMI reduced from 42 to 40 with recent weight loss.  Obstructive sleep apnea Improved sleep quality with CPAP therapy. Reports occasional daytime sleepiness, particularly after meals. CPAP usage is consistent with good air control  scores.  Menopausal vasomotor symptoms Veozah has significantly reduced hot flashes. Occasional hot flashes still occur but are less frequent.  Gastroesophageal reflux disease GERD symptoms managed with omeprazole . Reports occasional acid reflux, possibly related to abdominal hernia.  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 No imaging available to assess the nodule. Reports pain radiating to the back, possibly related to muscle spasm. - Prescribed tizanidine  4 mg, up to four times a day as needed for muscle spasms  Abdominal hernia Presence of abdominal hernia noted.  Vitamin D  deficiency Vitamin D  levels to be updated with current blood work. - Ordered vitamin D  level    Orders Placed This Encounter  Procedures   POCT HgB A1C   In addition, I have reviewed and discussed with patient certain preventive protocols, quality metrics, and best practice recommendations. A written personalized care plan for preventive services as well as general preventive health recommendations were provided to patient.   Misty Agent, MD   10/11/2024   Return in about 3 months (around 01/09/2025) for Cholesterol, HTN, DM.

## 2024-10-11 ENCOUNTER — Ambulatory Visit: Admitting: Family Medicine

## 2024-10-11 ENCOUNTER — Encounter: Payer: Self-pay | Admitting: Family Medicine

## 2024-10-11 VITALS — BP 124/82 | HR 97 | Ht 67.0 in | Wt 261.2 lb

## 2024-10-11 DIAGNOSIS — E1159 Type 2 diabetes mellitus with other circulatory complications: Secondary | ICD-10-CM | POA: Diagnosis not present

## 2024-10-11 DIAGNOSIS — N951 Menopausal and female climacteric states: Secondary | ICD-10-CM

## 2024-10-11 DIAGNOSIS — I152 Hypertension secondary to endocrine disorders: Secondary | ICD-10-CM

## 2024-10-11 DIAGNOSIS — R222 Localized swelling, mass and lump, trunk: Secondary | ICD-10-CM

## 2024-10-11 DIAGNOSIS — E559 Vitamin D deficiency, unspecified: Secondary | ICD-10-CM

## 2024-10-11 DIAGNOSIS — Z0001 Encounter for general adult medical examination with abnormal findings: Secondary | ICD-10-CM

## 2024-10-11 DIAGNOSIS — E1165 Type 2 diabetes mellitus with hyperglycemia: Secondary | ICD-10-CM | POA: Diagnosis not present

## 2024-10-11 DIAGNOSIS — E7800 Pure hypercholesterolemia, unspecified: Secondary | ICD-10-CM

## 2024-10-11 DIAGNOSIS — Z0000 Encounter for general adult medical examination without abnormal findings: Secondary | ICD-10-CM

## 2024-10-11 LAB — POCT GLYCOSYLATED HEMOGLOBIN (HGB A1C): Hemoglobin A1C: 5.3 % (ref 4.0–5.6)

## 2024-10-11 MED ORDER — TIZANIDINE HCL 4 MG PO TABS: 4.0000 mg | ORAL_TABLET | Freq: Four times a day (QID) | ORAL | 0 refills | Status: AC | PRN

## 2024-10-11 NOTE — Patient Instructions (Signed)
 SABRA

## 2024-10-18 ENCOUNTER — Ambulatory Visit: Admitting: Dermatology

## 2024-11-01 ENCOUNTER — Ambulatory Visit: Admitting: Sleep Medicine

## 2024-11-15 ENCOUNTER — Encounter

## 2024-11-22 ENCOUNTER — Ambulatory Visit: Admitting: Sleep Medicine

## 2025-10-12 ENCOUNTER — Ambulatory Visit
